# Patient Record
Sex: Female | Born: 1995 | Race: Black or African American | Hispanic: No | Marital: Single | Smoking: Never smoker
Health system: Southern US, Community
[De-identification: ages and names within clinical notes are randomized; demographics above are authoritative.]

## PROBLEM LIST (undated history)

## (undated) HISTORY — PX: OTHER SURGICAL HISTORY: SHX169

---
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
## 2020-03-03 ENCOUNTER — Other Ambulatory Visit (HOSPITAL_COMMUNITY)
Admission: RE | Admit: 2020-03-03 | Discharge: 2020-03-03 | Disposition: A | Discharge status: Home / Self Care | Payer: No Typology Code available for payment source | Source: Ambulatory Visit | Attending: Advanced Practice Midwife | Admitting: Advanced Practice Midwife

## 2020-03-03 ENCOUNTER — Encounter: Payer: Self-pay | Admitting: Advanced Practice Midwife

## 2020-03-03 ENCOUNTER — Ambulatory Visit (INDEPENDENT_AMBULATORY_CARE_PROVIDER_SITE_OTHER): Payer: No Typology Code available for payment source | Admitting: Advanced Practice Midwife

## 2020-03-03 ENCOUNTER — Other Ambulatory Visit: Payer: Self-pay

## 2020-03-03 VITALS — BP 117/63 | HR 85 | Ht 63.0 in | Wt 149.0 lb

## 2020-03-03 DIAGNOSIS — Z3A12 12 weeks gestation of pregnancy: Secondary | ICD-10-CM | POA: Diagnosis not present

## 2020-03-03 DIAGNOSIS — Z3401 Encounter for supervision of normal first pregnancy, first trimester: Secondary | ICD-10-CM | POA: Diagnosis not present

## 2020-03-03 DIAGNOSIS — Z3400 Encounter for supervision of normal first pregnancy, unspecified trimester: Secondary | ICD-10-CM | POA: Insufficient documentation

## 2020-03-03 LAB — POCT URINALYSIS DIPSTICK OB
Blood, UA: NEGATIVE
Glucose, UA: NEGATIVE
Nitrite, UA: NEGATIVE
Spec Grav, UA: 1.015 (ref 1.010–1.025)
pH, UA: 7.5 (ref 5.0–8.0)

## 2020-03-03 NOTE — Progress Notes (Signed)
DATING AND VIABILITY SONOGRAM   Jessica Callahan is a 24 y.o. year old G1P0 with LMP Patient's last menstrual period was 12/10/2019 (exact date). which would correlate to  [redacted]w[redacted]d weeks gestation.  She has regular menstrual cycles.   She is here today for a confirmatory initial sonogram.    GESTATION: SINGLETON     FETAL ACTIVITY:          Heart rate     187          The fetus is very active.    GESTATIONAL AGE AND  BIOMETRICS:  Gestational criteria: Estimated Date of Delivery: 09/15/20 by LMP now at [redacted]w[redacted]d  Previous Scans:0      CROWN RUMP LENGTH           4.68 cm  11-3weeks                                                                               AVERAGE EGA(BY THIS SCAN):  11-3 weeks  WORKING EDD( LMP ): 09-15-2020     TECHNICIAN COMMENTS: Patient informed that the ultrasound is considered a limited obstetric ultrasound and is not intended to be a complete ultrasound exam. Patient also informed that the ultrasound is not being completed with the intent of assessing for fetal or placental anomalies or any pelvic abnormalities. Explained that the purpose of today's ultrasound is to assess for fetal heart rate. Patient acknowledges the purpose of the exam and the limitations of the study.      Armandina Stammer 03/03/2020 9:26 AM

## 2020-03-03 NOTE — Progress Notes (Signed)
Subjective:    Jessica Callahan is a G1P0 [redacted]w[redacted]d being seen today for her first obstetrical visit.  Her obstetrical history is significant for Primigravida. Patient does intend to breast feed. Pregnancy history fully reviewed.  Patient reports no complaints.  Vitals:   03/03/20 0849 03/03/20 0851  BP: 117/63   Pulse: 85   Weight: 149 lb (67.6 kg)   Height:  5\' 3"  (1.6 m)    HISTORY: OB History  Gravida Para Term Preterm AB Living  1            SAB TAB Ectopic Multiple Live Births               # Outcome Date GA Lbr Len/2nd Weight Sex Delivery Anes PTL Lv  1 Current            History reviewed. No pertinent past medical history.  No family history on file.   Exam    Uterus:  Fundal Height: 12 cm  Pelvic Exam:    Perineum: No Hemorrhoids, Normal Perineum   Vulva: Bartholin's, Urethra, Skene's normal   Vagina:  normal mucosa, normal discharge   pH:    Cervix: no cervical motion tenderness   Adnexa: no mass, fullness, tenderness   Bony Pelvis: gynecoid  System: Breast:  normal appearance, no masses or tenderness   Skin: normal coloration and turgor, no rashes    Neurologic: oriented, grossly non-focal   Extremities: normal strength, tone, and muscle mass   HEENT neck supple with midline trachea   Mouth/Teeth mucous membranes moist, pharynx normal without lesions   Neck supple   Cardiovascular: regular rate and rhythm   Respiratory:  appears well, vitals normal, no respiratory distress, acyanotic, normal RR, ear and throat exam is normal, neck free of mass or lymphadenopathy, chest clear, no wheezing, crepitations, rhonchi, normal symmetric air entry   Abdomen: soft, non-tender; bowel sounds normal; no masses,  no organomegaly   Urinary: urethral meatus normal   The patient was counseled on the potential benefits and lack of known risks of COVID vaccination, during pregnancy and breastfeeding, on today's visit. The patient's questions and concerns were addressed today.  The patient is still unsure of her decision for vaccination. The patient is aware that if she chooses not to get an employee mandated vaccination we will provide documentation for her employers in the form of a letter unless a specific exemption form is submitted to the provider. The patient is aware that this documentation is a deferment of a mandated vaccination that will need to be renewed by a provider periodically.        Assessment:    Pregnancy: G1P0 Patient Active Problem List   Diagnosis Date Noted  . Supervision of normal first pregnancy, antepartum 03/03/2020        Plan:     Initial labs drawn. Continue prenatal vitamins. Discussed and offered genetic screening options, including Quad screen/AFP, NIPS testing, and option to decline testing. Benefits/risks/alternatives reviewed. Pt aware that anatomy 03/05/2020 is form of genetic screening with lower accuracy in detecting trisomies than blood work.  Pt chooses genetic screening today. NIPS: ordered. Ultrasound discussed; fetal anatomic survey: ordered. Problem list reviewed and updated. The nature of [facility] with multiple MDs and other Advanced Practice Providers was explained to patient; also emphasized that residents, students are part of our team. Routine obstetric precautions reviewed.   Follow up in 6 weeks. 50% of 30 min visit spent on counseling and coordination  of care.   Welcomed to practice Routines reviewed   Wynelle Bourgeois 03/03/2020

## 2020-03-04 LAB — GC/CHLAMYDIA PROBE AMP (~~LOC~~) NOT AT ARMC
Chlamydia: NEGATIVE
Comment: NEGATIVE
Comment: NORMAL
Neisseria Gonorrhea: NEGATIVE

## 2020-03-07 LAB — URINE CULTURE, OB REFLEX

## 2020-03-07 LAB — CULTURE, OB URINE

## 2020-03-12 LAB — HEMOGLOBPATHY+FER W/A THAL RFX
Ferritin: 84 ng/mL (ref 15–150)
Hgb A2: 3.1 % (ref 1.8–3.2)
Hgb A: 96.9 % (ref 96.4–98.8)
Hgb F: 0 % (ref 0.0–2.0)
Hgb S: 0 %

## 2020-03-12 LAB — CBC/D/PLT+RPR+RH+ABO+RUB AB...
Antibody Screen: NEGATIVE
Basophils Absolute: 0.1 10*3/uL (ref 0.0–0.2)
Basos: 1 %
EOS (ABSOLUTE): 0.2 10*3/uL (ref 0.0–0.4)
Eos: 2 %
HCV Ab: <0.1 s/co ratio (ref 0.0–0.9)
HIV Screen 4th Generation wRfx: NONREACTIVE
Hematocrit: 37.6 % (ref 34.0–46.6)
Hemoglobin: 12 g/dL (ref 11.1–15.9)
Hepatitis B Surface Ag: NEGATIVE
Immature Grans (Abs): 0 10*3/uL (ref 0.0–0.1)
Immature Granulocytes: 0 %
Lymphocytes Absolute: 2 10*3/uL (ref 0.7–3.1)
Lymphs: 17 %
MCH: 28.8 pg (ref 26.6–33.0)
MCHC: 31.9 g/dL (ref 31.5–35.7)
MCV: 90 fL (ref 79–97)
Monocytes Absolute: 0.9 10*3/uL (ref 0.1–0.9)
Monocytes: 7 %
Neutrophils Absolute: 8.5 10*3/uL — ABNORMAL HIGH (ref 1.4–7.0)
Neutrophils: 73 %
Platelets: 186 10*3/uL (ref 150–450)
RBC: 4.16 x10E6/uL (ref 3.77–5.28)
RDW: 12.8 % (ref 11.7–15.4)
RPR Ser Ql: NONREACTIVE
Rh Factor: POSITIVE
Rubella Antibodies, IGG: 6.73 index (ref >0.99)
WBC: 11.7 10*3/uL — ABNORMAL HIGH (ref 3.4–10.8)

## 2020-03-12 LAB — SMN1 COPY NUMBER ANALYSIS (SMA CARRIER SCREENING)

## 2020-03-12 LAB — HCV INTERPRETATION

## 2020-03-30 ENCOUNTER — Telehealth: Payer: Self-pay

## 2020-03-30 NOTE — Telephone Encounter (Signed)
Note created for patient. Armandina Stammer RN

## 2020-03-30 NOTE — Telephone Encounter (Signed)
Patient left message that she needed a note to get out of class. Left message for her to return call to office for more clarification. Armandina Stammer RN

## 2020-04-20 ENCOUNTER — Telehealth: Payer: Self-pay

## 2020-04-20 NOTE — Telephone Encounter (Signed)
Amy from Dr. Koleen Distance, DDS office called requesting a verbal order for pt to have dental x rays. Verbal order was given.Heyden Jaber l Kamaree Berkel, CMA

## 2020-04-21 ENCOUNTER — Encounter: Payer: Self-pay | Admitting: Advanced Practice Midwife

## 2020-04-21 ENCOUNTER — Other Ambulatory Visit: Payer: Self-pay

## 2020-04-21 ENCOUNTER — Ambulatory Visit (INDEPENDENT_AMBULATORY_CARE_PROVIDER_SITE_OTHER): Payer: No Typology Code available for payment source | Admitting: Advanced Practice Midwife

## 2020-04-21 VITALS — BP 106/63 | HR 85 | Wt 146.0 lb

## 2020-04-21 DIAGNOSIS — Z3400 Encounter for supervision of normal first pregnancy, unspecified trimester: Secondary | ICD-10-CM

## 2020-04-21 DIAGNOSIS — Z23 Encounter for immunization: Secondary | ICD-10-CM

## 2020-04-21 DIAGNOSIS — Z3A19 19 weeks gestation of pregnancy: Secondary | ICD-10-CM

## 2020-04-21 NOTE — Addendum Note (Signed)
Addended by: Lorelle Gibbs L on: 04/21/2020 10:44 AM   Modules accepted: Orders

## 2020-04-21 NOTE — Patient Instructions (Signed)

## 2020-04-21 NOTE — Progress Notes (Signed)
   PRENATAL VISIT NOTE  Subjective:  Jessica Callahan is a 24 y.o. G1P0 at [redacted]w[redacted]d being seen today for ongoing prenatal care.  She is currently monitored for the following issues for this low-risk pregnancy and has Supervision of normal first pregnancy, antepartum and Needs flu shot on their problem list.  Patient reports no complaints.  Contractions: Not present. Vag. Bleeding: None.  Movement: Present. Denies leaking of fluid.   The following portions of the patient's history were reviewed and updated as appropriate: allergies, current medications, past family history, past medical history, past social history, past surgical history and problem list.   Objective:   Vitals:   04/21/20 1003  BP: 106/63  Pulse: 85  Weight: 146 lb (66.2 kg)    Fetal Status: Fetal Heart Rate (bpm): 151   Movement: Present     General:  Alert, oriented and cooperative. Patient is in no acute distress.  Skin: Skin is warm and dry. No rash noted.   Cardiovascular: Normal heart rate noted  Respiratory: Normal respiratory effort, no problems with respiration noted  Abdomen: Soft, gravid, appropriate for gestational age.  Pain/Pressure: Absent     Pelvic: Cervical exam deferred        Extremities: Normal range of motion.  Edema: None  Mental Status: Normal mood and affect. Normal behavior. Normal judgment and thought content.   Assessment and Plan:  Pregnancy: G1P0 at [redacted]w[redacted]d 1. [redacted] weeks gestation of pregnancy Lots of fetal movement Excited to do reveal party   "Its a Boy!" - AFP, Serum, Open Spina Bifida  2. Needs flu shot Done today  3. Supervision of normal first pregnancy, antepartum [place] tomorrow  Preterm labor symptoms and general obstetric precautions including but not limited to vaginal bleeding, contractions, leaking of fluid and fetal movement were reviewed in detail with the patient. Please refer to After Visit Summary for other counseling recommendations.   Return in about 4 weeks (around  05/19/2020) for [employer].  Future Appointments  Date Time Provider Department Center  04/22/2020  2:45 PM [facility]  05/19/2020 10:10 AM Aviva Signs, CNM [facility] None    Wynelle Bourgeois, CNM

## 2020-04-22 ENCOUNTER — Ambulatory Visit: Payer: No Typology Code available for payment source | Attending: Advanced Practice Midwife

## 2020-04-22 DIAGNOSIS — Z3400 Encounter for supervision of normal first pregnancy, unspecified trimester: Secondary | ICD-10-CM | POA: Insufficient documentation

## 2020-04-22 DIAGNOSIS — Z3A12 12 weeks gestation of pregnancy: Secondary | ICD-10-CM | POA: Diagnosis present

## 2020-04-23 LAB — AFP, SERUM, OPEN SPINA BIFIDA
AFP MoM: 1.02
AFP Value: 57.3 ng/mL
Gest. Age on Collection Date: 19 weeks
Maternal Age At EDD: 23.4 yr
OSBR Risk 1 IN: 10000
Test Results:: NEGATIVE
Weight: 146 [lb_av]

## 2020-05-19 ENCOUNTER — Encounter: Payer: Self-pay | Admitting: Advanced Practice Midwife

## 2020-05-19 ENCOUNTER — Other Ambulatory Visit: Payer: Self-pay

## 2020-05-19 ENCOUNTER — Ambulatory Visit (INDEPENDENT_AMBULATORY_CARE_PROVIDER_SITE_OTHER): Payer: No Typology Code available for payment source | Admitting: Advanced Practice Midwife

## 2020-05-19 VITALS — BP 116/71 | HR 82 | Wt 152.0 lb

## 2020-05-19 DIAGNOSIS — Z3A23 23 weeks gestation of pregnancy: Secondary | ICD-10-CM

## 2020-05-19 DIAGNOSIS — Z3400 Encounter for supervision of normal first pregnancy, unspecified trimester: Secondary | ICD-10-CM

## 2020-05-19 DIAGNOSIS — N949 Unspecified condition associated with female genital organs and menstrual cycle: Secondary | ICD-10-CM

## 2020-05-19 NOTE — Progress Notes (Signed)
   PRENATAL VISIT NOTE  Subjective:  Jessica Callahan is a 24 y.o. G1P0 at [redacted]w[redacted]d being seen today for ongoing prenatal care.  She is currently monitored for the following issues for this low-risk pregnancy and has Supervision of normal first pregnancy, antepartum and Needs flu shot on their problem list.  Patient reports some round ligament pain.  Contractions: Not present. Vag. Bleeding: None.  Movement: Present. Denies leaking of fluid.   The following portions of the patient's history were reviewed and updated as appropriate: allergies, current medications, past family history, past medical history, past social history, past surgical history and problem list.   Objective:   Vitals:   05/19/20 1002  BP: 116/71  Pulse: 82  Weight: 152 lb (68.9 kg)    Fetal Status: Fetal Heart Rate (bpm): 145   Movement: Present     General:  Alert, oriented and cooperative. Patient is in no acute distress.  Skin: Skin is warm and dry. No rash noted.   Cardiovascular: Normal heart rate noted  Respiratory: Normal respiratory effort, no problems with respiration noted  Abdomen: Soft, gravid, appropriate for gestational age.  Pain/Pressure: Absent     Pelvic: Cervical exam deferred        Extremities: Normal range of motion.  Edema: None  Mental Status: Normal mood and affect. Normal behavior. Normal judgment and thought content.   Assessment and Plan:  Pregnancy: G1P0 at [redacted]w[redacted]d 1. Supervision of normal first pregnancy, antepartum     Discussed second trimester topics  2. Round ligament pain    Discussed, reassured  3. [redacted] weeks gestation of pregnancy     Plan glucola next visit  Preterm labor symptoms and general obstetric precautions including but not limited to vaginal bleeding, contractions, leaking of fluid and fetal movement were reviewed in detail with the patient. Please refer to After Visit Summary for other counseling recommendations.   Return in about 5 weeks (around 06/23/2020) for [facility].  Future Appointments  Date Time Provider Department Center  06/23/2020  9:30 AM Aviva Signs, CNM [facility] None    Wynelle Bourgeois, CNM

## 2020-05-19 NOTE — Patient Instructions (Addendum)
Second Trimester of Pregnancy  The second trimester is from week 14 through week 27 (month 4 through 6). This is often the time in pregnancy that you feel your best. Often times, morning sickness has lessened or quit. You may have more energy, and you may get hungry more often. Your unborn baby is growing rapidly. At the end of the sixth month, he or she is about 9 inches long and weighs about 1 pounds. You will likely feel the baby move between 18 and 20 weeks of pregnancy. Follow these instructions at home: Medicines  Take over-the-counter and prescription medicines only as told by your doctor. Some medicines are safe and some medicines are not safe during pregnancy.  Take a prenatal vitamin that contains at least 600 micrograms (mcg) of folic acid.  If you have trouble pooping (constipation), take medicine that will make your stool soft (stool softener) if your doctor approves. Eating and drinking   Eat regular, healthy meals.  Avoid raw meat and uncooked cheese.  If you get low calcium from the food you eat, talk to your doctor about taking a daily calcium supplement.  Avoid foods that are high in fat and sugars, such as fried and sweet foods.  If you feel sick to your stomach (nauseous) or throw up (vomit): ? Eat 4 or 5 small meals a day instead of 3 large meals. ? Try eating a few soda crackers. ? Drink liquids between meals instead of during meals.  To prevent constipation: ? Eat foods that are high in fiber, like fresh fruits and vegetables, whole grains, and beans. ? Drink enough fluids to keep your pee (urine) clear or pale yellow. Activity  Exercise only as told by your doctor. Stop exercising if you start to have cramps.  Do not exercise if it is too hot, too humid, or if you are in a place of great height (high altitude).  Avoid heavy lifting.  Wear low-heeled shoes. Sit and stand up straight.  You can continue to have sex unless your doctor tells you not  to. Relieving pain and discomfort  Wear a good support bra if your breasts are tender.  Take warm water baths (sitz baths) to soothe pain or discomfort caused by hemorrhoids. Use hemorrhoid cream if your doctor approves.  Rest with your legs raised if you have leg cramps or low back pain.  If you develop puffy, bulging veins (varicose veins) in your legs: ? Wear support hose or compression stockings as told by your doctor. ? Raise (elevate) your feet for 15 minutes, 3-4 times a day. ? Limit salt in your food. Prenatal care  Write down your questions. Take them to your prenatal visits.  Keep all your prenatal visits as told by your doctor. This is important. Safety  Wear your seat belt when driving.  Make a list of emergency phone numbers, including numbers for family, friends, the hospital, and police and fire departments. General instructions  Ask your doctor about the right foods to eat or for help finding a counselor, if you need these services.  Ask your doctor about local prenatal classes. Begin classes before month 6 of your pregnancy.  Do not use hot tubs, steam rooms, or saunas.  Do not douche or use tampons or scented sanitary pads.  Do not cross your legs for long periods of time.  Visit your dentist if you have not done so. Use a soft toothbrush to brush your teeth. Floss gently.  Avoid all smoking, herbs,   and alcohol. Avoid drugs that are not approved by your doctor.  Do not use any products that contain nicotine or tobacco, such as cigarettes and e-cigarettes. If you need help quitting, ask your doctor.  Avoid cat litter boxes and soil used by cats. These carry germs that can cause birth defects in the baby and can cause a loss of your baby (miscarriage) or stillbirth. Contact a doctor if:  You have mild cramps or pressure in your lower belly.  You have pain when you pee (urinate).  You have bad smelling fluid coming from your vagina.  You continue to  feel sick to your stomach (nauseous), throw up (vomit), or have watery poop (diarrhea).  You have a nagging pain in your belly area.  You feel dizzy. Get help right away if:  You have a fever.  You are leaking fluid from your vagina.  You have spotting or bleeding from your vagina.  You have severe belly cramping or pain.  You lose or gain weight rapidly.  You have trouble catching your breath and have chest pain.  You notice sudden or extreme puffiness (swelling) of your face, hands, ankles, feet, or legs.  You have not felt the baby move in over an hour.  You have severe headaches that do not go away when you take medicine.  You have trouble seeing. Summary  The second trimester is from week 14 through week 27 (months 4 through 6). This is often the time in pregnancy that you feel your best.  To take care of yourself and your unborn baby, you will need to eat healthy meals, take medicines only if your doctor tells you to do so, and do activities that are safe for you and your baby.  Call your doctor if you get sick or if you notice anything unusual about your pregnancy. Also, call your doctor if you need help with the right food to eat, or if you want to know what activities are safe for you. This information is not intended to replace advice given to you by your health care provider. Make sure you discuss any questions you have with your health care provider. Document Revised: 10/19/2018 Document Reviewed: 08/02/2016 Elsevier Patient Education  2020 [employer] Inc[employer] Round Ligament Pain  The round ligament is a cord of muscle and tissue that helps support the uterus. It can become a source of pain during pregnancy if it becomes stretched or twisted as the baby grows. The pain usually begins in the second trimester (13-28 weeks) of pregnancy, and it can come and go until the baby is delivered. It is not a serious problem, and it does not cause harm to the baby. Round ligament  pain is usually a short, sharp, and pinching pain, but it can also be a dull, lingering, and aching pain. The pain is felt in the lower side of the abdomen or in the groin. It usually starts deep in the groin and moves up to the outside of the hip area. The pain may occur when you:  Suddenly change position, such as quickly going from a sitting to standing position.  Roll over in bed.  Cough or sneeze.  Do physical activity. Follow these instructions at home:   Watch your condition for any changes.  When the pain starts, relax. Then try any of these methods to help with the pain: ? Sitting down. ? Flexing your knees up to your abdomen. ? Lying on your side with one pillow under your   abdomen and another pillow between your legs. ? Sitting in a warm bath for 15-20 minutes or until the pain goes away.  Take over-the-counter and prescription medicines only as told by your health care provider.  Move slowly when you sit down or stand up.  Avoid long walks if they cause pain.  Stop or reduce your physical activities if they cause pain.  Keep all follow-up visits as told by your health care provider. This is important. Contact a health care provider if:  Your pain does not go away with treatment.  You feel pain in your back that you did not have before.  Your medicine is not helping. Get help right away if:  You have a fever or chills.  You develop uterine contractions.  You have vaginal bleeding.  You have nausea or vomiting.  You have diarrhea.  You have pain when you urinate. Summary  Round ligament pain is felt in the lower abdomen or groin. It is usually a short, sharp, and pinching pain. It can also be a dull, lingering, and aching pain.  This pain usually begins in the second trimester (13-28 weeks). It occurs because the uterus is stretching with the growing baby, and it is not harmful to the baby.  You may notice the pain when you suddenly change position,  when you cough or sneeze, or during physical activity.  Relaxing, flexing your knees to your abdomen, lying on one side, or taking a warm bath may help to get rid of the pain.  Get help from your health care provider if the pain does not go away or if you have vaginal bleeding, nausea, vomiting, diarrhea, or painful urination. This information is not intended to replace advice given to you by your health care provider. Make sure you discuss any questions you have with your health care provider. Document Revised: 12/13/2017 Document Reviewed: 12/13/2017 Elsevier Patient Education  2020 Elsevier Inc.  

## 2020-06-10 ENCOUNTER — Encounter: Payer: No Typology Code available for payment source | Admitting: Family Medicine

## 2020-06-11 ENCOUNTER — Ambulatory Visit (INDEPENDENT_AMBULATORY_CARE_PROVIDER_SITE_OTHER): Payer: No Typology Code available for payment source | Admitting: Family Medicine

## 2020-06-11 ENCOUNTER — Other Ambulatory Visit: Payer: Self-pay

## 2020-06-11 ENCOUNTER — Encounter: Payer: No Typology Code available for payment source | Admitting: Family Medicine

## 2020-06-11 VITALS — BP 112/66 | HR 79 | Wt 157.0 lb

## 2020-06-11 DIAGNOSIS — Z3400 Encounter for supervision of normal first pregnancy, unspecified trimester: Secondary | ICD-10-CM

## 2020-06-11 DIAGNOSIS — N764 Abscess of vulva: Secondary | ICD-10-CM

## 2020-06-11 MED ORDER — SULFAMETHOXAZOLE-TRIMETHOPRIM 800-160 MG PO TABS: 1.0000 | ORAL_TABLET | Freq: Two times a day (BID) | ORAL | 1 refills | Status: DC

## 2020-06-11 MED ORDER — MICONAZOLE NITRATE 2 % VA CREA: 1.0000 | TOPICAL_CREAM | Freq: Every day | VAGINAL | 2 refills | Status: DC

## 2020-06-11 NOTE — Progress Notes (Signed)
   Subjective:    Patient ID: Jessica Callahan, female    DOB: [birth year], 24 y.o.   MRN: [id number]  HPI Patient has abscess in her left inguinal crease. Started about 10 days ago. Was using hot compresses. Started to spontaneously drain 5 days ago and is improving, but still has a large area that is firm.   Review of Systems     Objective:   Physical Exam Vitals reviewed. Exam conducted with a chaperone present.  Constitutional:      Appearance: Normal appearance.  Genitourinary:   Neurological:     Mental Status: She is alert.           Assessment & Plan:  1. Supervision of normal first pregnancy, antepartum   2. Left genital labial abscess Bactrium, warm compresses. Miconazole for yeast if needed. Return if starts to increase in size.

## 2020-06-11 NOTE — Progress Notes (Signed)
Patient states she has a boil on the inside of her leg. Patient states it has gotten smaller.

## 2020-06-23 ENCOUNTER — Other Ambulatory Visit: Payer: Self-pay

## 2020-06-23 ENCOUNTER — Ambulatory Visit (INDEPENDENT_AMBULATORY_CARE_PROVIDER_SITE_OTHER): Payer: No Typology Code available for payment source | Admitting: Advanced Practice Midwife

## 2020-06-23 ENCOUNTER — Encounter: Payer: Self-pay | Admitting: Advanced Practice Midwife

## 2020-06-23 VITALS — BP 113/68 | HR 78

## 2020-06-23 DIAGNOSIS — Z23 Encounter for immunization: Secondary | ICD-10-CM

## 2020-06-23 DIAGNOSIS — Z3400 Encounter for supervision of normal first pregnancy, unspecified trimester: Secondary | ICD-10-CM

## 2020-06-23 DIAGNOSIS — Z3A28 28 weeks gestation of pregnancy: Secondary | ICD-10-CM

## 2020-06-23 NOTE — Progress Notes (Signed)
   PRENATAL VISIT NOTE  Subjective:  Jessica Callahan is a 24 y.o. G1P0 at [redacted]w[redacted]d being seen today for ongoing prenatal care.  She is currently monitored for the following issues for this low-risk pregnancy and has Supervision of normal first pregnancy, antepartum and Needs flu shot on their problem list.   Just graduated with BS in Kinesiology  Patient reports no complaints.  Contractions: Not present. Vag. Bleeding: None.  Movement: Present. Denies leaking of fluid.   The following portions of the patient's history were reviewed and updated as appropriate: allergies, current medications, past family history, past medical history, past social history, past surgical history and problem list.   Objective:   Vitals:   06/23/20 0923  BP: 113/68  Pulse: 78    Fetal Status: Fetal Heart Rate (bpm): 150   Movement: Present     General:  Alert, oriented and cooperative. Patient is in no acute distress.  Skin: Skin is warm and dry. No rash noted.   Cardiovascular: Normal heart rate noted  Respiratory: Normal respiratory effort, no problems with respiration noted  Abdomen: Soft, gravid, appropriate for gestational age.  Pain/Pressure: Absent     Pelvic: Cervical exam deferred        Extremities: Normal range of motion.  Edema: None  Mental Status: Normal mood and affect. Normal behavior. Normal judgment and thought content.   Assessment and Plan:  Pregnancy: G1P0 at [redacted]w[redacted]d 1. Supervision of normal first pregnancy, antepartum  - Glucose Tolerance, 2 Hours w/1 Hour - CBC - RPR - HIV antibody (with reflex)  2. [redacted] weeks gestation of pregnancy     Doing well, excited  - Glucose Tolerance, 2 Hours w/1 Hour - CBC - RPR - HIV antibody (with reflex)  Preterm labor symptoms and general obstetric precautions including but not limited to vaginal bleeding, contractions, leaking of fluid and fetal movement were reviewed in detail with the patient. Please refer to After Visit Summary for other  counseling recommendations.   Return in about 2 weeks (around 07/07/2020) for [facility].   Wynelle Bourgeois, CNM

## 2020-06-23 NOTE — Patient Instructions (Signed)
Glucose Tolerance Test During Pregnancy Why am I having this test? The glucose tolerance test (GTT) is done to check how your body processes sugar (glucose). This is one of several tests used to diagnose diabetes that develops during pregnancy (gestational diabetes mellitus). Gestational diabetes is a temporary form of diabetes that some women develop during pregnancy. It usually occurs during the second trimester of pregnancy and goes away after delivery. Testing (screening) for gestational diabetes usually occurs between 24 and 28 weeks of pregnancy. You may have the GTT test after having a 1-hour glucose screening test if the results from that test indicate that you may have gestational diabetes. You may also have this test if:  You have a history of gestational diabetes.  You have a history of giving birth to very large babies or have experienced repeated fetal loss (stillbirth).  You have signs and symptoms of diabetes, such as: ? Changes in your vision. ? Tingling or numbness in your hands or feet. ? Changes in hunger, thirst, and urination that are not otherwise explained by your pregnancy. What is being tested? This test measures the amount of glucose in your blood at different times during a period of 3 hours. This indicates how well your body is able to process glucose. What kind of sample is taken?  Blood samples are required for this test. They are usually collected by inserting a needle into a blood vessel. How do I prepare for this test?  For 3 days before your test, eat normally. Have plenty of carbohydrate-rich foods.  Follow instructions from your health care provider about: ? Eating or drinking restrictions on the day of the test. You may be asked to not eat or drink anything other than water (fast) starting 8-10 hours before the test. ? Changing or stopping your regular medicines. Some medicines may interfere with this test. Tell a health care provider about:  All  medicines you are taking, including vitamins, herbs, eye drops, creams, and over-the-counter medicines.  Any blood disorders you have.  Any surgeries you have had.  Any medical conditions you have. What happens during the test? First, your blood glucose will be measured. This is referred to as your fasting blood glucose, since you fasted before the test. Then, you will drink a glucose solution that contains a certain amount of glucose. Your blood glucose will be measured again 1, 2, and 3 hours after drinking the solution. This test takes about 3 hours to complete. You will need to stay at the testing location during this time. During the testing period:  Do not eat or drink anything other than the glucose solution.  Do not exercise.  Do not use any products that contain nicotine or tobacco, such as cigarettes and e-cigarettes. If you need help stopping, ask your health care provider. The testing procedure may vary among health care providers and hospitals. How are the results reported? Your results will be reported as milligrams of glucose per deciliter of blood (mg/dL) or millimoles per liter (mmol/L). Your health care provider will compare your results to normal ranges that were established after testing a large group of people (reference ranges). Reference ranges may vary among labs and hospitals. For this test, common reference ranges are:  Fasting: less than 95-105 mg/dL (5.3-5.8 mmol/L).  1 hour after drinking glucose: less than 180-190 mg/dL (10.0-10.5 mmol/L).  2 hours after drinking glucose: less than 155-165 mg/dL (8.6-9.2 mmol/L).  3 hours after drinking glucose: 140-145 mg/dL (7.8-8.1 mmol/L). What do the   results mean? Results within reference ranges are considered normal, meaning that your glucose levels are well-controlled. If two or more of your blood glucose levels are high, you may be diagnosed with gestational diabetes. If only one level is high, your health care  provider may suggest repeat testing or other tests to confirm a diagnosis. Talk with your health care provider about what your results mean. Questions to ask your health care provider Ask your health care provider, or the department that is doing the test:  When will my results be ready?  How will I get my results?  What are my treatment options?  What other tests do I need?  What are my next steps? Summary  The glucose tolerance test (GTT) is one of several tests used to diagnose diabetes that develops during pregnancy (gestational diabetes mellitus). Gestational diabetes is a temporary form of diabetes that some women develop during pregnancy.  You may have the GTT test after having a 1-hour glucose screening test if the results from that test indicate that you may have gestational diabetes. You may also have this test if you have any symptoms or risk factors for gestational diabetes.  Talk with your health care provider about what your results mean. This information is not intended to replace advice given to you by your health care provider. Make sure you discuss any questions you have with your health care provider. Document Revised: 10/18/2018 Document Reviewed: 02/06/2017 Elsevier Patient Education  2020 Elsevier Inc.  

## 2020-06-24 LAB — CBC
Hematocrit: 35.8 % (ref 34.0–46.6)
Hemoglobin: 11.8 g/dL (ref 11.1–15.9)
MCH: 29.8 pg (ref 26.6–33.0)
MCHC: 33 g/dL (ref 31.5–35.7)
MCV: 90 fL (ref 79–97)
Platelets: 174 10*3/uL (ref 150–450)
RBC: 3.96 x10E6/uL (ref 3.77–5.28)
RDW: 12.4 % (ref 11.7–15.4)
WBC: 19 10*3/uL — ABNORMAL HIGH (ref 3.4–10.8)

## 2020-06-24 LAB — RPR: RPR Ser Ql: NONREACTIVE

## 2020-06-24 LAB — GLUCOSE TOLERANCE, 2 HOURS W/ 1HR
Glucose, 1 hour: 158 mg/dL (ref 65–179)
Glucose, 2 hour: 146 mg/dL (ref 65–152)
Glucose, Fasting: 73 mg/dL (ref 65–91)

## 2020-06-24 LAB — HIV ANTIBODY (ROUTINE TESTING W REFLEX): HIV Screen 4th Generation wRfx: NONREACTIVE

## 2020-07-07 ENCOUNTER — Other Ambulatory Visit: Payer: Self-pay

## 2020-07-07 ENCOUNTER — Ambulatory Visit (INDEPENDENT_AMBULATORY_CARE_PROVIDER_SITE_OTHER): Payer: No Typology Code available for payment source | Admitting: Obstetrics & Gynecology

## 2020-07-07 VITALS — BP 112/65 | HR 83 | Wt 163.0 lb

## 2020-07-07 DIAGNOSIS — Z3A30 30 weeks gestation of pregnancy: Secondary | ICD-10-CM

## 2020-07-07 DIAGNOSIS — Z3400 Encounter for supervision of normal first pregnancy, unspecified trimester: Secondary | ICD-10-CM

## 2020-07-07 MED ORDER — AMBULATORY NON FORMULARY MEDICATION: 1.0000 | 0 refills | Status: DC

## 2020-07-07 NOTE — Progress Notes (Signed)
   PRENATAL VISIT NOTE  Subjective:  Jessica Callahan is a 24 y.o. G1P0 at [redacted]w[redacted]d being seen today for ongoing prenatal care.  She is currently monitored for the following issues for this low-risk pregnancy and has Supervision of normal first pregnancy, antepartum and Needs flu shot on their problem list.  Patient reports no complaints.  Contractions: Not present. Vag. Bleeding: None.  Movement: Present. Denies leaking of fluid.   She just graduated from [employer].  Was able to walk in December.  Had four family member who could attend but it was live streamed.   The following portions of the patient's history were reviewed and updated as appropriate: allergies, current medications, past family history, past medical history, past social history, past surgical history and problem list.   Objective:   Vitals:   07/07/20 1554  BP: 112/65  Pulse: 83  Weight: 163 lb (73.9 kg)    Fetal Status: Fetal Heart Rate (bpm): 150 Fundal Height: 30 cm Movement: Present     General:  Alert, oriented and cooperative. Patient is in no acute distress.  Skin: Skin is warm and dry. No rash noted.   Cardiovascular: Normal heart rate noted  Respiratory: Normal respiratory effort, no problems with respiration noted  Abdomen: Soft, gravid, appropriate for gestational age.  Pain/Pressure: Present     Pelvic: Cervical exam deferred        Extremities: Normal range of motion.  Edema: None  Mental Status: Normal mood and affect. Normal behavior. Normal judgment and thought content.   Assessment and Plan:  Pregnancy: G1P0 at [redacted]w[redacted]d 1. [redacted] weeks gestation of pregnancy - taking PNV  2. Supervision of normal first pregnancy, antepartum - she is considering water birth.  Aware needs to do class and seen CNM.   Preterm labor symptoms and general obstetric precautions including but not limited to vaginal bleeding, contractions, leaking of fluid and fetal movement were reviewed in detail with the patient. Please refer to  After Visit Summary for other counseling recommendations.   Return in about 2 weeks (around 07/21/2020) for Office ob visit (MD or APP).  Future Appointments  Date Time Provider Department Center  07/23/2020  3:15 PM Levie Heritage, DO [facility] None    Jerene Bears, MD

## 2020-07-07 NOTE — Addendum Note (Signed)
Addended by: Mikey Bussing on: 07/07/2020 04:35 PM   Modules accepted: Orders

## 2020-07-11 NOTE — L&D Delivery Note (Signed)
Delivery Note At 5:26 PM a viable female was delivered via waterbirth (Presentation: OA).  APGAR: 9&9, weight pending  .   Placenta status: delivered intact with gentle traction.      Anesthesia:  none Episiotomy:  None Lacerations:  Second degree, repaired Suture Repair: 3.0 vicryl Est. Blood Loss (mL):  75cc  Mom to postpartum.  Baby to Couplet care / Skin to Skin. Delivering clinicians were Luna Kitchens and Casper Harrison  Casper Harrison, MD [facility] Family Medicine Fellow, [facility] for [facility], [facility]  Charlesetta Garibaldi [facility] 09/09/2020, 5:36 PM

## 2020-07-23 ENCOUNTER — Other Ambulatory Visit: Payer: Self-pay

## 2020-07-23 ENCOUNTER — Ambulatory Visit (INDEPENDENT_AMBULATORY_CARE_PROVIDER_SITE_OTHER): Payer: No Typology Code available for payment source | Admitting: Family Medicine

## 2020-07-23 DIAGNOSIS — Z3400 Encounter for supervision of normal first pregnancy, unspecified trimester: Secondary | ICD-10-CM

## 2020-07-23 NOTE — Progress Notes (Signed)
   PRENATAL VISIT NOTE  Subjective:  Jessica Callahan is a 25 y.o. G1P0 at [redacted]w[redacted]d being seen today for ongoing prenatal care.  She is currently monitored for the following issues for this low-risk pregnancy and has Supervision of normal first pregnancy, antepartum and Needs flu shot on their problem list.  Patient reports no complaints.  Contractions: Not present. Vag. Bleeding: None.  Movement: Present. Denies leaking of fluid.   The following portions of the patient's history were reviewed and updated as appropriate: allergies, current medications, past family history, past medical history, past social history, past surgical history and problem list.   Objective:   Vitals:   07/23/20 1517  BP: 114/77  Pulse: 94  Weight: 164 lb (74.4 kg)    Fetal Status: Fetal Heart Rate (bpm): 151   Movement: Present     General:  Alert, oriented and cooperative. Patient is in no acute distress.  Skin: Skin is warm and dry. No rash noted.   Cardiovascular: Normal heart rate noted  Respiratory: Normal respiratory effort, no problems with respiration noted  Abdomen: Soft, gravid, appropriate for gestational age.  Pain/Pressure: Present     Pelvic: Cervical exam deferred        Extremities: Normal range of motion.  Edema: None  Mental Status: Normal mood and affect. Normal behavior. Normal judgment and thought content.   Assessment and Plan:  Pregnancy: G1P0 at [redacted]w[redacted]d 1. Supervision of normal first pregnancy, antepartum FHT and FH normal  Preterm labor symptoms and general obstetric precautions including but not limited to vaginal bleeding, contractions, leaking of fluid and fetal movement were reviewed in detail with the patient. Please refer to After Visit Summary for other counseling recommendations.   No follow-ups on file.  Future Appointments  Date Time Provider Department Center  08/04/2020 10:05 AM Aviva Signs, CNM [facility] None  08/18/2020 11:15 AM Aviva Signs, CNM [facility] None   08/27/2020 11:15 AM Levie Heritage, DO [facility] None  09/03/2020 11:15 AM Levie Heritage, DO [facility] None  09/10/2020 11:15 AM Levie Heritage, DO [facility] None    Levie Heritage, DO

## 2020-08-04 ENCOUNTER — Encounter: Payer: Self-pay | Admitting: Advanced Practice Midwife

## 2020-08-04 ENCOUNTER — Other Ambulatory Visit: Payer: Self-pay

## 2020-08-04 ENCOUNTER — Ambulatory Visit (INDEPENDENT_AMBULATORY_CARE_PROVIDER_SITE_OTHER): Payer: No Typology Code available for payment source | Admitting: Advanced Practice Midwife

## 2020-08-04 VITALS — BP 110/60 | HR 82 | Wt 166.0 lb

## 2020-08-04 DIAGNOSIS — Z3A34 34 weeks gestation of pregnancy: Secondary | ICD-10-CM

## 2020-08-04 DIAGNOSIS — Z3400 Encounter for supervision of normal first pregnancy, unspecified trimester: Secondary | ICD-10-CM

## 2020-08-04 NOTE — Progress Notes (Signed)
   PRENATAL VISIT NOTE  Subjective:  Jessica Callahan is a 25 y.o. G1P0 at [redacted]w[redacted]d being seen today for ongoing prenatal care.  She is currently monitored for the following issues for this low-risk pregnancy and has Supervision of normal first pregnancy, antepartum on their problem list.  Patient reports no complaints.  Contractions: Irregular. Vag. Bleeding: None.  Movement: Present. Denies leaking of fluid.   The following portions of the patient's history were reviewed and updated as appropriate: allergies, current medications, past family history, past medical history, past social history, past surgical history and problem list.   Objective:   Vitals:   08/04/20 1005  BP: 110/60  Pulse: 82  Weight: 166 lb (75.3 kg)    Fetal Status: Fetal Heart Rate (bpm): 152   Movement: Present     General:  Alert, oriented and cooperative. Patient is in no acute distress.  Skin: Skin is warm and dry. No rash noted.   Cardiovascular: Normal heart rate noted  Respiratory: Normal respiratory effort, no problems with respiration noted  Abdomen: Soft, gravid, appropriate for gestational age.  Pain/Pressure: Absent     Pelvic: Cervical exam deferred        Extremities: Normal range of motion.  Edema: None  Mental Status: Normal mood and affect. Normal behavior. Normal judgment and thought content.   Assessment and Plan:  Pregnancy: G1P0 at [redacted]w[redacted]d 1. [redacted] weeks gestation of pregnancy      Wants to consider waterbirth      Issues reviewed.   Needs to take class, info given      Next visit will sign consent after class done      Discussed flexibility, things that will risk her out   2. Supervision of normal first pregnancy, antepartum     GBS next visit     PTL precautions  Preterm labor symptoms and general obstetric precautions including but not limited to vaginal bleeding, contractions, leaking of fluid and fetal movement were reviewed in detail with the patient. Please refer to After Visit Summary  for other counseling recommendations.   Return in about 2 weeks (around 08/18/2020) for [facility]  Future Appointments  Date Time Provider Department Center  08/18/2020 11:15 AM Aviva Signs, CNM [facility] None  08/27/2020 11:15 AM Levie Heritage, DO [facility] None  09/03/2020 11:15 AM Levie Heritage, DO [facility] None  09/10/2020 11:15 AM Adrian Blackwater Rhona Raider, DO [facility] None    Wynelle Bourgeois, CNM

## 2020-08-04 NOTE — Patient Instructions (Signed)
Thinking About Waterbirth???  You must attend a Waterbirth class at [facility]  3rd Wednesday of every month from 7-9pm  Free  Register by calling [phone] or online at [email or web address]  Bring us the certificate from the class  Waterbirth supplies needed for [facility] patients:  Our practice has a Birth Pool in a Box tub at the hospital that you can borrow  You will need to purchase an accessory kit that has all needed supplies through [facility] (  ) or online  Or you can purchase the supplies separately: o Single-use disposable tub liner for Birth Pool in a Box (REGULAR size) o New garden hose labeled "lead-free", "suitable for drinking water", "non-toxic" OR "water potable" o Garden hose to remove the dirty water o Electric drain pump to remove water (We recommend 792 gallon per hour or greater pump.)  o Fish net o Bathing suit top (optional) o Long-handled mirror (optional)  [employer] sells tubs for ~ $120 if you would rather purchase your own tub  [employer] (www.[email or web address]) $275 for tub rental/set-up & take down/kit   Things that would prevent you from having a waterbirth:  Premature, <37wks  Previous cesarean birth  Presence of thick meconium-stained fluid  Multiple gestation (Twins, triplets, etc.)  Uncontrolled diabetes  Hypertension  Heavy vaginal bleeding  Non-reassuring fetal heart rate  Active infection (MRSA, etc.)  If your labor has to be induced  Other risk issues identified by your obstetrical provider    

## 2020-08-18 ENCOUNTER — Other Ambulatory Visit: Payer: Self-pay

## 2020-08-18 ENCOUNTER — Encounter: Payer: Self-pay | Admitting: Advanced Practice Midwife

## 2020-08-18 ENCOUNTER — Other Ambulatory Visit (HOSPITAL_COMMUNITY)
Admission: RE | Admit: 2020-08-18 | Discharge: 2020-08-18 | Disposition: A | Discharge status: Home / Self Care | Payer: No Typology Code available for payment source | Source: Ambulatory Visit | Attending: Advanced Practice Midwife | Admitting: Advanced Practice Midwife

## 2020-08-18 ENCOUNTER — Ambulatory Visit (INDEPENDENT_AMBULATORY_CARE_PROVIDER_SITE_OTHER): Payer: No Typology Code available for payment source | Admitting: Advanced Practice Midwife

## 2020-08-18 VITALS — BP 115/66 | HR 86 | Wt 169.0 lb

## 2020-08-18 DIAGNOSIS — Z3A36 36 weeks gestation of pregnancy: Secondary | ICD-10-CM

## 2020-08-18 DIAGNOSIS — Z3400 Encounter for supervision of normal first pregnancy, unspecified trimester: Secondary | ICD-10-CM

## 2020-08-18 NOTE — Progress Notes (Signed)
   PRENATAL VISIT NOTE  Subjective:  Jessica Callahan is a 25 y.o. G1P0 at [redacted]w[redacted]d being seen today for ongoing prenatal care.  She is currently monitored for the following issues for this low-risk pregnancy and has Supervision of normal first pregnancy, antepartum on their problem list.  Patient reports no complaints.  Contractions: Irritability. Vag. Bleeding: None.  Movement: Present. Denies leaking of fluid.   The following portions of the patient's history were reviewed and updated as appropriate: allergies, current medications, past family history, past medical history, past social history, past surgical history and problem list.   Objective:   Vitals:   08/18/20 1123  BP: 115/66  Pulse: 86  Weight: 169 lb (76.7 kg)    Fetal Status: Fetal Heart Rate (bpm): 150   Movement: Present     General:  Alert, oriented and cooperative. Patient is in no acute distress.  Skin: Skin is warm and dry. No rash noted.   Cardiovascular: Normal heart rate noted  Respiratory: Normal respiratory effort, no problems with respiration noted  Abdomen: Soft, gravid, appropriate for gestational age.  Pain/Pressure: Present     Pelvic:  Closed/ 40% / -3/ vertex  Extremities: Normal range of motion.  Edema: None  Mental Status: Normal mood and affect. Normal behavior. Normal judgment and thought content.   Assessment and Plan:  Pregnancy: G1P0 at [redacted]w[redacted]d 1. Supervision of normal first pregnancy, antepartum      Discussed FM, signs of labor - Culture, beta strep (group b only) - GC/Chlamydia probe amp ([place])not at [facility]  2. [redacted] weeks gestation of pregnancy     Signed up for WB class on 08/01/20     Discussed issues again re: risking out, need to sign consent after class  Preterm labor symptoms and general obstetric precautions including but not limited to vaginal bleeding, contractions, leaking of fluid and fetal movement were reviewed in detail with the patient. Please refer to After Visit Summary for  other counseling recommendations.   Return in about 1 week (around 08/25/2020) for [facility].  Future Appointments  Date Time Provider Department Center  08/27/2020 11:15 AM Levie Heritage, DO [facility] None  09/03/2020 11:15 AM Levie Heritage, DO [facility] None  09/10/2020 11:15 AM Adrian Blackwater Rhona Raider, DO [facility] None    Wynelle Bourgeois, CNM

## 2020-08-18 NOTE — Patient Instructions (Signed)

## 2020-08-19 LAB — GC/CHLAMYDIA PROBE AMP (~~LOC~~) NOT AT ARMC
Chlamydia: NEGATIVE
Comment: NEGATIVE
Comment: NORMAL
Neisseria Gonorrhea: NEGATIVE

## 2020-08-21 ENCOUNTER — Encounter: Payer: Self-pay | Admitting: Advanced Practice Midwife

## 2020-08-21 DIAGNOSIS — B951 Streptococcus, group B, as the cause of diseases classified elsewhere: Secondary | ICD-10-CM | POA: Insufficient documentation

## 2020-08-21 LAB — CULTURE, BETA STREP (GROUP B ONLY): Strep Gp B Culture: POSITIVE — AB

## 2020-08-26 ENCOUNTER — Encounter: Payer: No Typology Code available for payment source | Admitting: Obstetrics and Gynecology

## 2020-08-27 ENCOUNTER — Other Ambulatory Visit: Payer: Self-pay

## 2020-08-27 ENCOUNTER — Encounter: Payer: Self-pay | Admitting: Family Medicine

## 2020-08-27 ENCOUNTER — Ambulatory Visit (INDEPENDENT_AMBULATORY_CARE_PROVIDER_SITE_OTHER): Payer: No Typology Code available for payment source | Admitting: Family Medicine

## 2020-08-27 VITALS — BP 110/72 | HR 87 | Wt 170.0 lb

## 2020-08-27 DIAGNOSIS — Z3A37 37 weeks gestation of pregnancy: Secondary | ICD-10-CM

## 2020-08-27 DIAGNOSIS — Z3400 Encounter for supervision of normal first pregnancy, unspecified trimester: Secondary | ICD-10-CM

## 2020-08-27 DIAGNOSIS — B951 Streptococcus, group B, as the cause of diseases classified elsewhere: Secondary | ICD-10-CM

## 2020-08-27 NOTE — Progress Notes (Signed)
ROB [redacted]w[redacted]d GBS + on 08/18/20.  DU:KGUR  Pt wants cervix check today.

## 2020-08-27 NOTE — Progress Notes (Signed)
   PRENATAL VISIT NOTE  Subjective:  Jessica Callahan is a 25 y.o. G1P0 at [redacted]w[redacted]d being seen today for ongoing prenatal care.  She is currently monitored for the following issues for this low-risk pregnancy and has Supervision of normal first pregnancy, antepartum and Positive GBS test on their problem list.  Patient reports occasional contractions.  Contractions: Irritability. Vag. Bleeding: None.  Movement: Present. Denies leaking of fluid.   The following portions of the patient's history were reviewed and updated as appropriate: allergies, current medications, past family history, past medical history, past social history, past surgical history and problem list.   Objective:   Vitals:   08/27/20 1135  BP: 110/72  Pulse: 87  Weight: 170 lb (77.1 kg)    Fetal Status: Fetal Heart Rate (bpm): 150 Fundal Height: 37 cm Movement: Present  Presentation: Vertex  General:  Alert, oriented and cooperative. Patient is in no acute distress.  Skin: Skin is warm and dry. No rash noted.   Cardiovascular: Normal heart rate noted  Respiratory: Normal respiratory effort, no problems with respiration noted  Abdomen: Soft, gravid, appropriate for gestational age.  Pain/Pressure: Absent     Pelvic: Cervical exam performed in the presence of a chaperone Dilation: 3 Effacement (%): 50 Station: -3  Extremities: Normal range of motion.  Edema: None  Mental Status: Normal mood and affect. Normal behavior. Normal judgment and thought content.   Assessment and Plan:  Pregnancy: G1P0 at [redacted]w[redacted]d 1. Supervision of normal first pregnancy, antepartum FHT and FH normal  2. [redacted] weeks gestation of pregnancy  3. GBS  Intrapartum PPx  Term labor symptoms and general obstetric precautions including but not limited to vaginal bleeding, contractions, leaking of fluid and fetal movement were reviewed in detail with the patient. Please refer to After Visit Summary for other counseling recommendations.   No follow-ups on  file.  Future Appointments  Date Time Provider Department Center  09/03/2020 11:15 AM Levie Heritage, DO [facility] None  09/10/2020 11:15 AM Levie Heritage, DO [facility] None    Levie Heritage, DO

## 2020-08-31 ENCOUNTER — Telehealth: Payer: Self-pay

## 2020-08-31 NOTE — Telephone Encounter (Signed)
Pt called stating her Dentist prescribed Amoxicillin for an infected tooth. Pt states she may have to have a root canal and wanted to make our office aware.  Markise Haymer l Joei Frangos, CMA

## 2020-09-03 ENCOUNTER — Other Ambulatory Visit: Payer: Self-pay

## 2020-09-03 ENCOUNTER — Ambulatory Visit (INDEPENDENT_AMBULATORY_CARE_PROVIDER_SITE_OTHER): Payer: No Typology Code available for payment source | Admitting: Family Medicine

## 2020-09-03 VITALS — BP 125/76 | HR 93 | Wt 168.0 lb

## 2020-09-03 DIAGNOSIS — Z3A38 38 weeks gestation of pregnancy: Secondary | ICD-10-CM

## 2020-09-03 DIAGNOSIS — Z3400 Encounter for supervision of normal first pregnancy, unspecified trimester: Secondary | ICD-10-CM

## 2020-09-03 DIAGNOSIS — B951 Streptococcus, group B, as the cause of diseases classified elsewhere: Secondary | ICD-10-CM

## 2020-09-03 NOTE — Progress Notes (Signed)
   PRENATAL VISIT NOTE  Subjective:  Jessica Callahan is a 25 y.o. G1P0 at [redacted]w[redacted]d being seen today for ongoing prenatal care.  She is currently monitored for the following issues for this low-risk pregnancy and has Supervision of normal first pregnancy, antepartum and Positive GBS test on their problem list.  Patient reports occasional contractions.  Contractions: Irritability. Vag. Bleeding: None.  Movement: Present. Denies leaking of fluid.   The following portions of the patient's history were reviewed and updated as appropriate: allergies, current medications, past family history, past medical history, past social history, past surgical history and problem list.   Objective:   Vitals:   09/03/20 1120  BP: 125/76  Pulse: 93  Weight: 168 lb (76.2 kg)    Fetal Status: Fetal Heart Rate (bpm): 160 Fundal Height: 38 cm Movement: Present     General:  Alert, oriented and cooperative. Patient is in no acute distress.  Skin: Skin is warm and dry. No rash noted.   Cardiovascular: Normal heart rate noted  Respiratory: Normal respiratory effort, no problems with respiration noted  Abdomen: Soft, gravid, appropriate for gestational age.  Pain/Pressure: Absent     Pelvic: Cervical exam deferred        Extremities: Normal range of motion.  Edema: None  Mental Status: Normal mood and affect. Normal behavior. Normal judgment and thought content.   Assessment and Plan:  Pregnancy: G1P0 at [redacted]w[redacted]d 1. [redacted] weeks gestation of pregnancy  2. Supervision of normal first pregnancy, antepartum FHT and FH normal  3. Positive GBS test Intrapartum PPx  Preterm labor symptoms and general obstetric precautions including but not limited to vaginal bleeding, contractions, leaking of fluid and fetal movement were reviewed in detail with the patient. Please refer to After Visit Summary for other counseling recommendations.   No follow-ups on file.  Future Appointments  Date Time Provider Department Center   09/10/2020 11:15 AM Levie Heritage, DO [facility] None    Levie Heritage, DO

## 2020-09-09 ENCOUNTER — Other Ambulatory Visit: Payer: Self-pay

## 2020-09-09 ENCOUNTER — Encounter (HOSPITAL_COMMUNITY): Payer: Self-pay | Admitting: Obstetrics and Gynecology

## 2020-09-09 ENCOUNTER — Inpatient Hospital Stay (HOSPITAL_COMMUNITY)
Admission: AD | Admit: 2020-09-09 | Discharge: 2020-09-11 | DRG: 807 | Disposition: A | Discharge status: Home / Self Care | Payer: No Typology Code available for payment source | Attending: Family Medicine | Admitting: Family Medicine

## 2020-09-09 DIAGNOSIS — O9081 Anemia of the puerperium: Secondary | ICD-10-CM | POA: Diagnosis not present

## 2020-09-09 DIAGNOSIS — O99824 Streptococcus B carrier state complicating childbirth: Secondary | ICD-10-CM | POA: Diagnosis present

## 2020-09-09 DIAGNOSIS — O26893 Other specified pregnancy related conditions, third trimester: Secondary | ICD-10-CM | POA: Diagnosis present

## 2020-09-09 DIAGNOSIS — Z3400 Encounter for supervision of normal first pregnancy, unspecified trimester: Secondary | ICD-10-CM

## 2020-09-09 DIAGNOSIS — R030 Elevated blood-pressure reading, without diagnosis of hypertension: Secondary | ICD-10-CM | POA: Diagnosis present

## 2020-09-09 DIAGNOSIS — B951 Streptococcus, group B, as the cause of diseases classified elsewhere: Secondary | ICD-10-CM

## 2020-09-09 DIAGNOSIS — O4693 Antepartum hemorrhage, unspecified, third trimester: Secondary | ICD-10-CM | POA: Diagnosis present

## 2020-09-09 DIAGNOSIS — Z3A39 39 weeks gestation of pregnancy: Secondary | ICD-10-CM

## 2020-09-09 DIAGNOSIS — Z20822 Contact with and (suspected) exposure to covid-19: Secondary | ICD-10-CM | POA: Diagnosis present

## 2020-09-09 LAB — COMPREHENSIVE METABOLIC PANEL
ALT: 38 U/L (ref 0–44)
AST: 26 U/L (ref 15–41)
Albumin: 2.7 g/dL — ABNORMAL LOW (ref 3.5–5.0)
Alkaline Phosphatase: 141 U/L — ABNORMAL HIGH (ref 38–126)
Anion gap: 9 (ref 5–15)
BUN: 5 mg/dL — ABNORMAL LOW (ref 6–20)
CO2: 20 mmol/L — ABNORMAL LOW (ref 22–32)
Calcium: 8.6 mg/dL — ABNORMAL LOW (ref 8.9–10.3)
Chloride: 105 mmol/L (ref 98–111)
Creatinine, Ser: 0.58 mg/dL (ref 0.44–1.00)
GFR, Estimated: >60 mL/min (ref >60)
Glucose, Bld: 76 mg/dL (ref 70–99)
Potassium: 3.9 mmol/L (ref 3.5–5.1)
Sodium: 134 mmol/L — ABNORMAL LOW (ref 135–145)
Total Bilirubin: 0.9 mg/dL (ref 0.3–1.2)
Total Protein: 6 g/dL — ABNORMAL LOW (ref 6.5–8.1)

## 2020-09-09 LAB — TYPE AND SCREEN
ABO/RH(D): AB POS
Antibody Screen: NEGATIVE

## 2020-09-09 LAB — CBC
HCT: 34.2 % — ABNORMAL LOW (ref 36.0–46.0)
Hemoglobin: 11.1 g/dL — ABNORMAL LOW (ref 12.0–15.0)
MCH: 29.4 pg (ref 26.0–34.0)
MCHC: 32.5 g/dL (ref 30.0–36.0)
MCV: 90.5 fL (ref 80.0–100.0)
Platelets: 187 10*3/uL (ref 150–400)
RBC: 3.78 MIL/uL — ABNORMAL LOW (ref 3.87–5.11)
RDW: 13.8 % (ref 11.5–15.5)
WBC: 16.1 10*3/uL — ABNORMAL HIGH (ref 4.0–10.5)
nRBC: 0 % (ref 0.0–0.2)

## 2020-09-09 LAB — RPR: RPR Ser Ql: NONREACTIVE

## 2020-09-09 LAB — RESP PANEL BY RT-PCR (FLU A&B, COVID) ARPGX2
Influenza A by PCR: NEGATIVE
Influenza B by PCR: NEGATIVE
SARS Coronavirus 2 by RT PCR: NEGATIVE

## 2020-09-09 LAB — PROTEIN / CREATININE RATIO, URINE
Creatinine, Urine: 143.26 mg/dL
Protein Creatinine Ratio: 0.13 mg/mg{Cre} (ref 0.00–0.15)
Total Protein, Urine: 19 mg/dL

## 2020-09-09 MED ORDER — EPHEDRINE 5 MG/ML INJ: 10.0000 mg | INTRAVENOUS | Status: DC | PRN

## 2020-09-09 MED ORDER — PHENYLEPHRINE 40 MCG/ML (10ML) SYRINGE FOR IV PUSH (FOR BLOOD PRESSURE SUPPORT): 80.0000 ug | PREFILLED_SYRINGE | INTRAVENOUS | Status: DC | PRN

## 2020-09-09 MED ORDER — LIDOCAINE HCL (PF) 1 % IJ SOLN
30.0000 mL | INTRAMUSCULAR | Status: DC | PRN
  Filled 2020-09-09: qty 30

## 2020-09-09 MED ORDER — SODIUM CHLORIDE 0.9 % IV SOLN
5.0000 10*6.[IU] | Freq: Once | INTRAVENOUS | Status: AC
  Administered 2020-09-09: 5 10*6.[IU] via INTRAVENOUS
  Filled 2020-09-09: qty 5

## 2020-09-09 MED ORDER — LACTATED RINGERS IV SOLN
500.0000 mL | INTRAVENOUS | Status: DC | PRN
  Administered 2020-09-09: 500 mL via INTRAVENOUS

## 2020-09-09 MED ORDER — FENTANYL-BUPIVACAINE-NACL 0.5-0.125-0.9 MG/250ML-% EP SOLN: 12.0000 mL/h | EPIDURAL | Status: DC | PRN

## 2020-09-09 MED ORDER — COCONUT OIL OIL
1.0000 "application " | TOPICAL_OIL | Status: DC | PRN
  Administered 2020-09-11: 1 via TOPICAL

## 2020-09-09 MED ORDER — OXYTOCIN-SODIUM CHLORIDE 30-0.9 UT/500ML-% IV SOLN
2.5000 [IU]/h | INTRAVENOUS | Status: DC
  Filled 2020-09-09: qty 500

## 2020-09-09 MED ORDER — DIPHENHYDRAMINE HCL 50 MG/ML IJ SOLN: 12.5000 mg | INTRAMUSCULAR | Status: DC | PRN

## 2020-09-09 MED ORDER — OXYTOCIN BOLUS FROM INFUSION
333.0000 mL | Freq: Once | INTRAVENOUS | Status: DC
Start: 2020-09-09 — End: 2020-09-09

## 2020-09-09 MED ORDER — PRENATAL MULTIVITAMIN CH
1.0000 | ORAL_TABLET | Freq: Every day | ORAL | Status: DC
  Administered 2020-09-10 – 2020-09-11 (×2): 1 via ORAL
  Filled 2020-09-09 (×2): qty 1

## 2020-09-09 MED ORDER — IBUPROFEN 600 MG PO TABS
600.0000 mg | ORAL_TABLET | Freq: Four times a day (QID) | ORAL | Status: DC
  Administered 2020-09-10 – 2020-09-11 (×7): 600 mg via ORAL
  Filled 2020-09-09 (×7): qty 1

## 2020-09-09 MED ORDER — SENNOSIDES-DOCUSATE SODIUM 8.6-50 MG PO TABS
2.0000 | ORAL_TABLET | Freq: Every day | ORAL | Status: DC
  Administered 2020-09-10 – 2020-09-11 (×2): 2 via ORAL
  Filled 2020-09-09 (×2): qty 2

## 2020-09-09 MED ORDER — BENZOCAINE-MENTHOL 20-0.5 % EX AERO: 1.0000 "application " | INHALATION_SPRAY | CUTANEOUS | Status: DC | PRN

## 2020-09-09 MED ORDER — LACTATED RINGERS IV SOLN
INTRAVENOUS | Status: DC
Start: 2020-09-09 — End: 2020-09-09

## 2020-09-09 MED ORDER — ONDANSETRON HCL 4 MG PO TABS: 4.0000 mg | ORAL_TABLET | ORAL | Status: DC | PRN

## 2020-09-09 MED ORDER — OXYCODONE-ACETAMINOPHEN 5-325 MG PO TABS: 1.0000 | ORAL_TABLET | ORAL | Status: DC | PRN

## 2020-09-09 MED ORDER — DIBUCAINE (PERIANAL) 1 % EX OINT: 1.0000 "application " | TOPICAL_OINTMENT | CUTANEOUS | Status: DC | PRN

## 2020-09-09 MED ORDER — WITCH HAZEL-GLYCERIN EX PADS: 1.0000 "application " | MEDICATED_PAD | CUTANEOUS | Status: DC | PRN

## 2020-09-09 MED ORDER — ONDANSETRON HCL 4 MG/2ML IJ SOLN: 4.0000 mg | INTRAMUSCULAR | Status: DC | PRN

## 2020-09-09 MED ORDER — ACETAMINOPHEN 325 MG PO TABS: 650.0000 mg | ORAL_TABLET | ORAL | Status: DC | PRN

## 2020-09-09 MED ORDER — DIPHENHYDRAMINE HCL 25 MG PO CAPS: 25.0000 mg | ORAL_CAPSULE | Freq: Four times a day (QID) | ORAL | Status: DC | PRN

## 2020-09-09 MED ORDER — ZOLPIDEM TARTRATE 5 MG PO TABS: 5.0000 mg | ORAL_TABLET | Freq: Every evening | ORAL | Status: DC | PRN

## 2020-09-09 MED ORDER — TETANUS-DIPHTH-ACELL PERTUSSIS 5-2.5-18.5 LF-MCG/0.5 IM SUSY: 0.5000 mL | PREFILLED_SYRINGE | Freq: Once | INTRAMUSCULAR | Status: DC

## 2020-09-09 MED ORDER — ONDANSETRON HCL 4 MG/2ML IJ SOLN: 4.0000 mg | Freq: Four times a day (QID) | INTRAMUSCULAR | Status: DC | PRN

## 2020-09-09 MED ORDER — SOD CITRATE-CITRIC ACID 500-334 MG/5ML PO SOLN: 30.0000 mL | ORAL | Status: DC | PRN

## 2020-09-09 MED ORDER — LACTATED RINGERS IV SOLN: 500.0000 mL | Freq: Once | INTRAVENOUS | Status: DC

## 2020-09-09 MED ORDER — FENTANYL CITRATE (PF) 100 MCG/2ML IJ SOLN
100.0000 ug | INTRAMUSCULAR | Status: DC | PRN
  Administered 2020-09-09: 100 ug via INTRAVENOUS
  Filled 2020-09-09: qty 2

## 2020-09-09 MED ORDER — SIMETHICONE 80 MG PO CHEW: 80.0000 mg | CHEWABLE_TABLET | ORAL | Status: DC | PRN

## 2020-09-09 MED ORDER — PENICILLIN G POT IN DEXTROSE 60000 UNIT/ML IV SOLN
3.0000 10*6.[IU] | INTRAVENOUS | Status: DC
  Administered 2020-09-09 (×2): 3 10*6.[IU] via INTRAVENOUS
  Filled 2020-09-09 (×2): qty 50

## 2020-09-09 MED ORDER — OXYCODONE-ACETAMINOPHEN 5-325 MG PO TABS: 2.0000 | ORAL_TABLET | ORAL | Status: DC | PRN

## 2020-09-09 MED ORDER — OXYTOCIN 10 UNIT/ML IJ SOLN
INTRAMUSCULAR | Status: AC
  Administered 2020-09-09: 10 [IU]
  Filled 2020-09-09: qty 1

## 2020-09-09 NOTE — MAU Note (Signed)
Patient reports to MAU with vaginal bleeding and cramping that started about 30 minutes ago.  Says she noticed some blood in her underwear and then just recently put on pad so she is unsure of the amount.  +FM.

## 2020-09-09 NOTE — Discharge Summary (Addendum)
Postpartum Discharge Summary   Patient Name: Jessica Callahan DOB: [birth year] MRN: [id number]  Date of admission: 09/09/2020 Delivery date:09/09/2020  Delivering provider: Janet Berlin  Date of discharge: 09/11/2020  Admitting diagnosis: Vaginal bleeding in pregnancy, third trimester [O46.93] Intrauterine pregnancy: [redacted]w[redacted]d    Secondary diagnosis:  Active Problems:   Positive GBS test   Vaginal bleeding in pregnancy, third trimester   Postpartum anemia  Additional problems: Elevated BP    Discharge diagnosis: Term Pregnancy Delivered                                              Post partum procedures:none Augmentation: AROM Complications: None  Hospital course: Onset of Labor With Vaginal Delivery      25y.o. yo G1P0 at 376w1das admitted in Latent Labor on 09/09/2020. Patient had an uncomplicated labor course as follows:  Membrane Rupture Time/Date: 1:09 PM ,09/09/2020   Delivery Method:Vaginal, Spontaneous  Episiotomy: None  Lacerations:  2nd degree  Patient had a postpartum course remarkable for being started on po Fe due to PPD#1 Hgb of 8.8 (was 11.1 on admission). She was also started on Norvasc [id number] to elevated BPs in the [id number]. She is ambulating, tolerating a regular diet, passing flatus, and urinating well. Patient is discharged home in stable condition on 09/11/20.  Newborn Data: Birth date:09/09/2020  Birth time:5:26 PM  Gender:Female  Living status:Living  Apgars:9 ,9  [phone]  Magnesium Sulfate received: No BMZ received: No Rhophylac:N/A MMR:N/A T-DaP:Given prenatally Flu: No Transfusion:No  Physical exam  Vitals:   09/10/20 0838 09/10/20 1504 09/10/20 2055 09/11/20 0501  BP: 136/79 137/69 117/71 121/75  Pulse: 89 87 88 90  Resp: 16  18 16   Temp: 98 F (36.7 C)  98.3 F (36.8 C) 98.8 F (37.1 C)  TempSrc: Oral  Oral   SpO2: 99%  99% 99%   General: alert, cooperative and no distress Lochia: appropriate Uterine Fundus: firm Incision:  N/A DVT Evaluation: No evidence of DVT seen on physical exam. Labs: Lab Results  Component Value Date   WBC 20.8 (H) 09/10/2020   HGB 8.8 (L) 09/10/2020   HCT 25.1 (L) 09/10/2020   MCV 87.8 09/10/2020   PLT 172 09/10/2020   CMP Latest Ref Rng & Units 09/09/2020  Glucose 70 - 99 mg/dL 76  BUN 6 - 20 mg/dL 5(L)  Creatinine 0.44 - 1.00 mg/dL 0.58  Sodium 135 - 145 mmol/L 134(L)  Potassium 3.5 - 5.1 mmol/L 3.9  Chloride 98 - 111 mmol/L 105  CO2 22 - 32 mmol/L 20(L)  Calcium 8.9 - 10.3 mg/dL 8.6(L)  Total Protein 6.5 - 8.1 g/dL 6.0(L)  Total Bilirubin 0.3 - 1.2 mg/dL 0.9  Alkaline Phos 38 - 126 U/L 141(H)  AST 15 - 41 U/L 26  ALT 0 - 44 U/L 38   Edinburgh Score: No flowsheet data found.   After visit meds:  Allergies as of 09/11/2020   No Known Allergies     Medication List    STOP taking these medications   amoxicillin 500 MG capsule Commonly known as: AMOXIL     TAKE these medications   acetaminophen 500 MG tablet Commonly known as: TYLENOL Take 1,000 mg by mouth every 6 (six) hours as needed for mild pain, headache or moderate pain.   AMBULATORY NON FORMULARY MEDICATION 1 Device  by Other route once a week. Blood pressure cuff/Medium  Monitored Regularly at home. ICD 10: Z34.90 LROB   amLODipine 5 MG tablet Commonly known as: NORVASC Take 1 tablet (5 mg total) by mouth daily.   coconut oil Oil Apply 1 application topically as needed.   Ferrous Fumarate 324 (106 Fe) MG Tabs tablet Commonly known as: HEMOCYTE - 106 mg FE Take 1 tablet (106 mg of iron total) by mouth every other day. Start taking on: September 12, 2020   ibuprofen 600 MG tablet Commonly known as: ADVIL Take 1 tablet (600 mg total) by mouth every 6 (six) hours.   norethindrone 0.35 MG tablet Commonly known as: Ortho Micronor Take 1 tablet (0.35 mg total) by mouth daily.   prenatal multivitamin Tabs tablet Take 1 tablet by mouth daily at 12 noon.       Discharge home in stable  condition Infant Feeding: Breast Infant Disposition:home with mother Discharge instruction: per After Visit Summary and Postpartum booklet. Activity: Advance as tolerated. Pelvic rest for 6 weeks.  Diet: routine diet Future Appointments: Future Appointments  Date Time Provider [place]  09/15/2020  1:30 PM [facility] NURSE [facility] None  10/20/2020 10:35 AM Seabron Spates, CNM [facility] None   Follow up Visit: Please schedule this patient for an In person postpartum visit in 6 weeks with the following provider: Any provider. Additional Postpartum F/U: possible IUD Insertion at six weeks, BP check in one week   Low risk pregnancy complicated by: uncomplicated Delivery mode:  Vaginal, Spontaneous  Anticipated Birth Control:  Home with POPs, considering IUD at outpatient f/u    09/11/2020 Alcus Dad, MD  I saw and evaluated the patient. I agree with the findings and the plan of care as documented in the resident's note.  Sharene Skeans, MD [facility] Family Medicine Fellow, [facility] for [facility] [place]

## 2020-09-09 NOTE — H&P (Signed)
Jessica Callahan is a 25 y.o. female G1P0 at [redacted]w[redacted]d presenting for vaginal bleeding and contractions. She reports contractions are painful, occurring about 4 times per hour since early this morning.  She went to the bathroom and saw bright red bleeding when wiping so came to the hospital.  The contractions are becoming stronger and closer together since onset.  Pregnancy has been uncomplicated, pt planning a waterbirth. Class attending and certificate in chart.  Needs consent on admission but assessment of bleeding first before waterbirth is offered today.  Anatomy [place] on 04/22/20 at 19 weeks wnl with EFW 47%, AFI wnl, largest pocket 3.94  GBS positive  Nursing Staff Provider  Office Location [facility]  Dating   12 wk [place]  Language  English  Anatomy US  normal  Flu Vaccine  04/21/2020 Genetic Screen  NIPS:   Low Risk Female  AFP:      TDaP vaccine   08/25/2019 Hgb A1C or  GTT Early  Third trimester Normal 2 hr GTT  COVID vaccine  Recommended @NOB    LAB RESULTS   Rhogam   Blood Type AB/Positive/-- (08/24 1027) AB+  Feeding Plan Breast  Antibody Negative (08/24 1027)Neg  Contraception  Undecided maybe IUD PP Rubella 6.73 (08/24 1027)Immune  Circumcision Yes RPR Non Reactive (12/14 1002) NR  Pediatrician   HBsAg Negative (08/24 1027) Neg  Support Person Brandon(FOB) HCVAb Negative  Prenatal Classes  HIV Non Reactive (12/14 1002)   Neg  BTL Consent  GBS Positive/-- (02/08 1126)(For PCN allergy, check sensitivities)   VBAC Consent  Pap Release signed    Hgb Electro   Normal AA  BP Cuff  CF     SMA  Neg    Waterbirth  [ ]  Class [ ]  Consent [x]  CNM visit    Induction  [ ]  Orders Entered [ ] Foley Y/N   OB History    Gravida  1   Para      Term      Preterm      AB      Living        SAB      IAB      Ectopic      Multiple      Live Births             History reviewed. No pertinent past medical history. Past Surgical History:  Procedure Laterality Date  . right wrist surgery      Family History: family history is not on file. Social History:  reports that she has never smoked. She has never used smokeless tobacco. She reports that she does not drink alcohol and does not use drugs.     Maternal Diabetes: No Genetic Screening: Normal Maternal Ultrasounds/Referrals: Normal Fetal Ultrasounds or other Referrals:  None Maternal Substance Abuse:  No Significant Maternal Medications:  None Significant Maternal Lab Results:  Group B Strep positive Other Comments:  None  Review of Systems  Constitutional: Negative for chills, fatigue and fever.  Eyes: Negative for visual disturbance.  Respiratory: Negative for shortness of breath.   Cardiovascular: Negative for chest pain.  Gastrointestinal: Negative for abdominal pain and vomiting.  Genitourinary: Positive for vaginal bleeding. Negative for difficulty urinating, dysuria, flank pain, pelvic pain, vaginal discharge and vaginal pain.  Neurological: Negative for dizziness and headaches.  Psychiatric/Behavioral: Negative.    Maternal Medical History:  Reason for admission: Contractions and vaginal bleeding.   Contractions: Onset was 3-5 hours ago.   Frequency: regular.  Perceived severity is moderate.    Fetal activity: Perceived fetal activity is normal.    Prenatal complications: no prenatal complications Prenatal Complications - Diabetes: none.    Dilation: 3.5 Effacement (%): 50 Station: -3 Exam by:: Judeth Horn NP Blood pressure 134/88, pulse 73, temperature 98.2 F (36.8 C), temperature source Oral, resp. rate 18, last menstrual period 12/10/2019, SpO2 100 %. Maternal Exam:  Uterine Assessment: Contraction strength is moderate.  Contraction frequency is regular.   Abdomen: Fetal presentation: vertex  Cervix: Cervix evaluated by digital exam.     Fetal Exam Fetal Monitor Review: Mode: ultrasound.   Baseline rate: 135.  Variability: moderate (6-25 bpm).   Pattern: accelerations present.     Fetal State Assessment: Category I - tracings are normal.     Physical Exam Vitals and nursing note reviewed.  Constitutional:      Appearance: She is well-developed and well-nourished.  Cardiovascular:     Rate and Rhythm: Normal rate and regular rhythm.     Heart sounds: Normal heart sounds.  Pulmonary:     Effort: Pulmonary effort is normal.     Breath sounds: Normal breath sounds.  Abdominal:     Palpations: Abdomen is soft.  Musculoskeletal:        General: Normal range of motion.     Cervical back: Normal range of motion.  Skin:    General: Skin is warm and dry.  Neurological:     Mental Status: She is alert and oriented to person, place, and time.  Psychiatric:        Mood and Affect: Mood and affect normal.        Behavior: Behavior normal.        Thought Content: Thought content normal.        Judgment: Judgment normal.     Prenatal labs: ABO, Rh: AB/Positive/-- (08/24 1027) Antibody: Negative (08/24 1027) Rubella: 6.73 (08/24 1027) RPR: Non Reactive (12/14 1002)  HBsAg: Negative (08/24 1027)  HIV: Non Reactive (12/14 1002)  GBS: Positive/-- (02/08 1126)   Assessment/Plan: G1P0 at [redacted]w[redacted]d admitted for vaginal bleeding/early labor GBS positive Isolated elevated BP  Admit to L&D Will observe bleeding/pad count on admission PCN for GBS prophylaxis PEC labs drawn on admission, watch BP  If bleeding is consistent with cervical change and remains light, and blood pressure is isolated and does not meet criteria for GHTN, will consider using intermittent FHR monitoring and filling tub for patient to use during labor/birth.  Pt aware that waterbirth is only available for low risk labor and that labor is unpredictable.      Sharen Counter 09/09/2020, 3:56 AM

## 2020-09-09 NOTE — Progress Notes (Signed)
Jessica Callahan is a 25 y.o. G1P0 at [redacted]w[redacted]d by ultrasound admitted for active labor/vaginal bleeding.  Subjective: Pt breathing with contractions, s/o in room for support.  Objective: BP 133/85   Pulse 94   Temp 98.1 F (36.7 C) (Oral)   Resp 18   LMP 12/10/2019 (Exact Date)   SpO2 100%  No intake/output data recorded. No intake/output data recorded.  FHT:  FHR: 135 bpm, variability: moderate,  accelerations:  Present,  decelerations:  Absent UC:   regular, every 3 minutes, mild to moderate to palpation SVE:   Dilation: 5 Effacement (%): 100 Station: -2 Exam by:: Misty Stanley CNM Bleeding on pad is scant/minimal at this time  Labs: Lab Results  Component Value Date   WBC 16.1 (H) 09/09/2020   HGB 11.1 (L) 09/09/2020   HCT 34.2 (L) 09/09/2020   MCV 90.5 09/09/2020   PLT 187 09/09/2020    Assessment / Plan: Spontaneous labor, progressing normally  Labor: Progressing normally No barriers to getting in tub for hydrotherapy/waterbirth at this time.  Preeclampsia:  n/a Fetal Wellbeing:  Category I Pain Control:  none I/D:  GBS positive on PCN Anticipated MOD:  NSVD  Sharen Counter 09/09/2020, 7:53 AM

## 2020-09-09 NOTE — MAU Provider Note (Signed)
History     CSN: [id number]  Arrival date and time: 09/09/20 0259   Event Date/Time   First Provider Initiated Contact with Patient 09/09/20 0315      Chief Complaint  Patient presents with  . Vaginal Bleeding  . Abdominal Pain    Cramping   HPI Jessica Callahan is a 25 y.o. G1P0 at 85w1dwho presents with vaginal bleeding & abdominal cramping. Symptoms started just prior to arrival. Reports bright red blood on toilet paper. Immediately put on a pad and came to the hospital. Also reports some painful contractions that occur more than 4 times per hour. Denies LOF. Denies abdominal trauma or fall. Had intercourse Monday. Reports positive fetal movement.  Denies history of hypertension, headache, visual disturbance, or epigastric pain.   OB History    Gravida  1   Para      Term      Preterm      AB      Living        SAB      IAB      Ectopic      Multiple      Live Births              History reviewed. No pertinent past medical history.  Past Surgical History:  Procedure Laterality Date  . right wrist surgery      History reviewed. No pertinent family history.  Social History   Tobacco Use  . Smoking status: Never Smoker  . Smokeless tobacco: Never Used  Substance Use Topics  . Alcohol use: Never  . Drug use: Never    Allergies: No Known Allergies  Medications Prior to Admission  Medication Sig Dispense Refill Last Dose  . Prenatal Vit-Fe Fumarate-FA (PRENATAL VITAMINS PO) Take by mouth.   09/08/2020 at Unknown time  . AMBULATORY NON FORMULARY MEDICATION 1 Device by Other route once a week. Blood pressure cuff/Medium  Monitored Regularly at home. ICD 10: Z34.90 LROB (Patient not taking: No sig reported) 1 kit 0     Review of Systems  Constitutional: Negative.   Gastrointestinal: Positive for abdominal pain.  Genitourinary: Positive for vaginal bleeding.   Physical Exam   Blood pressure 134/88, pulse 73, temperature 98.2 F (36.8 C),  temperature source Oral, resp. rate 18, last menstrual period 12/10/2019, SpO2 100 %.  Physical Exam Vitals and nursing note reviewed. Exam conducted with a chaperone present.  Constitutional:      General: She is not in acute distress.    Appearance: She is well-developed.  HENT:     Head: Normocephalic and atraumatic.  Pulmonary:     Effort: Pulmonary effort is normal. No respiratory distress.  Abdominal:     Palpations: Abdomen is soft.  Genitourinary:    General: Normal vulva.     Exam position: Lithotomy position.     Vagina: Bleeding present.     Comments: Small amount of thin bright red blood coming from os.   Dilation: 3.5 Effacement (%): 50 Cervical Position: Posterior Station: -3 Presentation: Vertex Exam by:: EJorje GuildNP  Neurological:     Mental Status: She is alert.    NST:  Baseline: 150 bpm, Variability: Good {> 6 bpm), Accelerations: Reactive and Decelerations: Absent  MAU Course  Procedures No results found for this or any previous visit (from the past 24 hour(s)).  MDM Patient is 39+ weeks with bleeding from unknown source. Will send to birthing suites. Patient's intake BP was elevated; no hx  or symptoms. May be due to stress of coming to the hospital. Will cycle BPs & collect preeclampsia labs.    Assessment and Plan   1. Vaginal bleeding in pregnancy, third trimester    -admit to birthing suites  Jorje Guild 09/09/2020, 3:30 AM

## 2020-09-09 NOTE — Lactation Note (Signed)
This note was copied from a baby's chart. Lactation Consultation Note  Patient Name: Boy Anagha Loseke [id number] Date: 09/09/2020 Reason for consult: L&D Initial assessment;Term Age:24 hours  LC went in to work with Mom. At the time of the visit, Mom under the care of RN for vaginal bleeding. LC will return once Mom is back in bed.   Maternal Data Has patient been taught Hand Expression?: Yes Does the patient have breastfeeding experience prior to this delivery?: No  Feeding Mother's Current Feeding Choice: Breast Milk  LATCH Score Latch: Repeated attempts needed to sustain latch, nipple held in mouth throughout feeding, stimulation needed to elicit sucking reflex.  Audible Swallowing: A few with stimulation  Type of Nipple: Everted at rest and after stimulation  Comfort (Breast/Nipple): Soft / non-tender  Hold (Positioning): Assistance needed to correctly position infant at breast and maintain latch.  LATCH Score: 7   Lactation Tools Discussed/Used    Interventions Interventions: Breast feeding basics reviewed;Assisted with latch;Skin to skin;Breast massage;Support pillows;Adjust position  Discharge Pump: Manual;Personal (Mom has a hand pump at home.) [facility] Program: Yes  Consult Status Consult Status: Follow-up Date: 09/10/20 Follow-up type: In-patient    Stony Stegmann  [name] 09/09/2020, 9:27 PM

## 2020-09-09 NOTE — Lactation Note (Signed)
This note was copied from a baby's chart. Lactation Consultation Note  Patient Name: Jessica Callahan [id number] Date: 09/09/2020 Reason for consult: L&D Initial assessment;Term Age:24 hours  (L&D - no charge for [facility] services) LC entered the room, mom was dong STS and infant was cuing to breastfeed. LC noticed mom is short shafted, LC ask mom to do breast stimulation and hand express small amount of colostrum out which help everted her nipple shaft more. Infant latched on mom's left breast using the football hold position, infant was on and off the breast ( not sustaining  latch)  for 8 minutes. Infant had 1st stool while feeding L&D RN changed diaper. Mom was taught hand expression and infant was given 4 mls of colostrum by spoon. LC placed hand pump in patient's room for mom  to pre-pump breast prior to latching infant due to  mom having  short shafted nipples. LC discussed with [facility] (RN)  1st shift to review how to use hand pump with mom.  Mom knows to call RN or LC on MBU  for latch assistance if needed.  LC discussed infant's input and out put with parents.  Maternal Data Has patient been taught Hand Expression?: Yes Does the patient have breastfeeding experience prior to this delivery?: No  Feeding Mother's Current Feeding Choice: Breast Milk  LATCH Score Latch: Repeated attempts needed to sustain latch, nipple held in mouth throughout feeding, stimulation needed to elicit sucking reflex.  Audible Swallowing: A few with stimulation  Type of Nipple: Everted at rest and after stimulation (Mom is short shafted and understand to pre-pump breast with hand pump prior to latching infant to help evert nipple shaft out more.)  Comfort (Breast/Nipple): Soft / non-tender  Hold (Positioning): Assistance needed to correctly position infant at breast and maintain latch.  LATCH Score: 7   Lactation Tools Discussed/Used    Interventions Interventions: Breast feeding basics reviewed;Assisted  with latch;Skin to skin;Hand express;Pre-pump if needed;Breast compression;Adjust position;Support pillows;Position options;Expressed milk;Hand pump;Education  Discharge Pump: Manual;Personal (Mom has a hand pump at home.) [facility] Program: Yes  Consult Status Consult Status: Follow-up Date: 09/10/20 Follow-up type: In-patient    Danelle Earthly 09/09/2020, 6:48 PM

## 2020-09-09 NOTE — Progress Notes (Signed)
   Maricel Swartzendruber is a 25 y.o. G1P0 at [redacted]w[redacted]d  admitted for SOL.   Subjective: Coping well, in the tub  Objective: Vitals:   09/09/20 0700 09/09/20 0705 09/09/20 0812 09/09/20 0907  BP:  133/85 123/74 122/75  Pulse:  94 80 78  Resp: 20 18    Temp:      TempSrc:      SpO2:       No intake/output data recorded.  FHT:  FHR: 140 bpm, variability: moderate,  accelerations:  Present,  decelerations:  Absent UC:   irregular, every 5 minutes SVE:   Dilation: 5 Effacement (%): 100 Station: -2 Exam by:: [employer]: Lab Results  Component Value Date   WBC 16.1 (H) 09/09/2020   HGB 11.1 (L) 09/09/2020   HCT 34.2 (L) 09/09/2020   MCV 90.5 09/09/2020   PLT 187 09/09/2020    Assessment / Plan: Spontaneous labor, progressing normally  Labor: Progressing normally Fetal Wellbeing:  Category I Pain Control:  Labor support without medications Anticipated MOD:  NSVD  Marylene Land 09/09/2020, 9:57 AM

## 2020-09-09 NOTE — Progress Notes (Signed)
    Jessica Callahan is a 25 y.o. G1P0 at [redacted]w[redacted]d  admitted for active labor  Subjective: Coping well, has been on birthing ball   Objective: Vitals:   09/09/20 0907 09/09/20 0937 09/09/20 1025 09/09/20 1201  BP: 122/75 116/79 115/75 119/78  Pulse: 78 71  77  Resp:      Temp:      TempSrc:      SpO2:       No intake/output data recorded.  FHT:  FHR: 140 bpm, variability: moderate,  accelerations:  Present,  decelerations:  Absent UC:   irregular,  SVE:   Dilation: 5 Effacement (%): 100 Station: -2 Exam by:: Mikella Linsley,cnm   Labs: Lab Results  Component Value Date   WBC 16.1 (H) 09/09/2020   HGB 11.1 (L) 09/09/2020   HCT 34.2 (L) 09/09/2020   MCV 90.5 09/09/2020   PLT 187 09/09/2020    Patient Vitals for the past 24 hrs:  BP Temp Temp src Pulse Resp SpO2  09/09/20 1315 127/80 -- -- 81 16 --  09/09/20 1201 119/78 -- -- 77 -- --  09/09/20 1025 115/75 -- -- -- -- --  09/09/20 0937 116/79 -- -- 71 -- --  09/09/20 0907 122/75 -- -- 78 -- --  09/09/20 0812 123/74 -- -- 80 -- --  09/09/20 0705 133/85 -- -- 94 18 --  09/09/20 0700 -- -- -- -- 20 --  09/09/20 0607 126/75 -- -- 71 18 --  09/09/20 0508 117/76 -- -- 82 19 --  09/09/20 0422 137/87 98.1 F (36.7 C) Oral 70 18 --  09/09/20 0400 135/78 -- -- 78 -- 100 %  09/09/20 0345 128/70 -- -- 74 -- 99 %  09/09/20 0330 130/73 -- -- 71 -- 99 %  09/09/20 0325 -- -- -- -- -- 100 %  09/09/20 0321 -- -- -- -- -- 100 %  09/09/20 0315 134/88 -- -- 73 18 100 %  09/09/20 0310 -- -- -- -- -- 100 %  09/09/20 0307 (!) 143/93 98.2 F (36.8 C) Oral 78 -- --      Assessment / Plan: Spontaneous labor, patient has not made change since this morning so AROM performed, small amoutn of bloody fluid -pre-e labs normal, no signs/symptoms of pre-e and vitals are all normal since admission.   Labor: doing well, no change since this morning so AROM performed.  Fetal Wellbeing:  Category I Pain Control:  Labor support without  medications Anticipated MOD:  NSVD  Marylene Land 09/09/2020, 1:16 PM

## 2020-09-09 NOTE — Lactation Note (Signed)
This note was copied from a baby's chart. Lactation Consultation Note  Patient Name: Jessica Callahan Date: 09/09/2020 Reason for consult: Initial assessment;Mother's request;Primapara;1st time breastfeeding;Term Age:24 hours Infant fed prior to visit for 20 minutes. LC not able to assess a latch as a result.  Mom flat nipples with areola edema. Reverse pressure softening and use of the manual to prepump before latching reviewed with Mother. LC went over feeding cues, keeping infant STS and offering both breasts during a feed.  Plan 1. To feed based on cues 8-12x in 24hr period no more than 3 hrs without an attempt.           2. Manual pump for pre pumping to elongate nipple before latching. (5-10 minutes on each breasts)           3. I and O sheet reviewed            4. LC brochure of inpatient and outpatient services reviewed.   Mom to call RN or [facility] for assistance with the next feeding.  Maternal Data Has patient been taught Hand Expression?: Yes Does the patient have breastfeeding experience prior to this delivery?: No  Feeding Mother's Current Feeding Choice: Breast Milk  LATCH Score Latch: Repeated attempts needed to sustain latch, nipple held in mouth throughout feeding, stimulation needed to elicit sucking reflex.  Audible Swallowing: A few with stimulation  Type of Nipple: Everted at rest and after stimulation  Comfort (Breast/Nipple): Soft / non-tender  Hold (Positioning): Assistance needed to correctly position infant at breast and maintain latch.  LATCH Score: 7   Lactation Tools Discussed/Used Tools: Pump;Flanges Flange Size: 24 Breast pump type: Manual Pump Education: Setup, frequency, and cleaning;Milk Storage Reason for Pumping: increase stimulation, nipples are flat with areola edema Pumping frequency: every 3 hrs for 5-10 minutes before latching  Interventions Interventions: Breast feeding basics reviewed;Hand pump;Skin to skin;Breast  massage;Position options;Hand express;Expressed milk;Education;Pre-pump if needed;Reverse pressure  Discharge Pump: Manual  Consult Status Consult Status: Follow-up Date: 09/10/20 Follow-up type: In-patient    Jessica Callahan  Jessica Callahan 09/09/2020, 10:55 PM

## 2020-09-10 ENCOUNTER — Encounter: Payer: No Typology Code available for payment source | Admitting: Family Medicine

## 2020-09-10 DIAGNOSIS — O9081 Anemia of the puerperium: Secondary | ICD-10-CM | POA: Diagnosis not present

## 2020-09-10 LAB — CBC
HCT: 25.1 % — ABNORMAL LOW (ref 36.0–46.0)
Hemoglobin: 8.8 g/dL — ABNORMAL LOW (ref 12.0–15.0)
MCH: 30.8 pg (ref 26.0–34.0)
MCHC: 35.1 g/dL (ref 30.0–36.0)
MCV: 87.8 fL (ref 80.0–100.0)
Platelets: 172 10*3/uL (ref 150–400)
RBC: 2.86 MIL/uL — ABNORMAL LOW (ref 3.87–5.11)
RDW: 13.8 % (ref 11.5–15.5)
WBC: 20.8 10*3/uL — ABNORMAL HIGH (ref 4.0–10.5)
nRBC: 0 % (ref 0.0–0.2)

## 2020-09-10 MED ORDER — AMLODIPINE BESYLATE 5 MG PO TABS
5.0000 mg | ORAL_TABLET | Freq: Every day | ORAL | Status: DC
  Administered 2020-09-10 – 2020-09-11 (×2): 5 mg via ORAL
  Filled 2020-09-10 (×2): qty 1

## 2020-09-10 MED ORDER — FERROUS FUMARATE 324 (106 FE) MG PO TABS
1.0000 | ORAL_TABLET | ORAL | Status: DC
  Administered 2020-09-10: 106 mg via ORAL
  Filled 2020-09-10: qty 1

## 2020-09-10 NOTE — Progress Notes (Signed)
Post Partum Day #1 Subjective: no complaints, up ad lib and tolerating PO; breastfeeding going well; plans on POPs for contraception; would like for her son to have a circumcision- consent reviewed and note placed in his chart; denies s/s anemia  Objective:  BP@MN  141/80 Blood pressure 120/75, pulse 73, temperature 98.7 F (37.1 C), temperature source Oral, resp. rate 16, last menstrual period 12/10/2019, SpO2 100 %, unknown if currently breastfeeding.  Physical Exam:  General: alert, cooperative and no distress Lochia: appropriate Uterine Fundus: firm DVT Evaluation: No evidence of DVT seen on physical exam.  Recent Labs    09/09/20 0410 09/10/20 0535  HGB 11.1* 8.8*  HCT 34.2* 25.1*    Assessment/Plan: Plan for discharge tomorrow and Circumcision prior to discharge  Postpartum anemia (delivery EBL only 75cc)>asymptomatic>start QOD po Fe Watch BPs- single elevation overnight with neg pre-e labs>asymptomatic    LOS: 1 day   Jessica Callahan CNM 09/10/2020, 7:47 AM

## 2020-09-10 NOTE — Lactation Note (Signed)
This note was copied from a baby's chart. Lactation Consultation Note  Patient Name: Boy Cristiana Yochim [id number] Date: 09/10/2020 Reason for consult: Follow-up assessment Age:24 hours   P1 mother whose infant is now 48 hours old.  This is a term baby at 39+1 weeks.  Baby was asleep in mother's arms when I arrived.  Mother had some basic breast feeding questions which I answered to her satisfaction.  Mother is familiar with hand expression and reviewed the importance of breast massage and hand expression.  Mother will spoon feed any EBM she obtains to baby.  Mother concerned that baby is not feeding for long periods.  Reassured mother that this is typical for an 26 hour old infant.  Discussed progression with feedings.  Mother stated that baby is showing cues.  She knows to call her RN/LC for latch assistance as needed.   Maternal Data    Feeding    LATCH Score Latch: Repeated attempts needed to sustain latch, nipple held in mouth throughout feeding, stimulation needed to elicit sucking reflex.  Audible Swallowing: A few with stimulation  Type of Nipple: Flat  Comfort (Breast/Nipple): Soft / non-tender  Hold (Positioning): Assistance needed to correctly position infant at breast and maintain latch.  LATCH Score: 6   Lactation Tools Discussed/Used    Interventions Interventions: Education  Discharge    Consult Status Consult Status: Follow-up Date: 09/11/20 Follow-up type: In-patient    Dora Sims 09/10/2020, 12:19 PM

## 2020-09-11 ENCOUNTER — Other Ambulatory Visit: Payer: Self-pay | Admitting: Family Medicine

## 2020-09-11 MED ORDER — NORETHINDRONE 0.35 MG PO TABS: 1.0000 | ORAL_TABLET | Freq: Every day | ORAL | 2 refills | Status: DC

## 2020-09-11 MED ORDER — IBUPROFEN 600 MG PO TABS: 600.0000 mg | ORAL_TABLET | Freq: Four times a day (QID) | ORAL | Status: DC

## 2020-09-11 MED ORDER — AMLODIPINE BESYLATE 5 MG PO TABS: 5.0000 mg | ORAL_TABLET | Freq: Every day | ORAL | 0 refills | Status: DC

## 2020-09-11 MED ORDER — COCONUT OIL OIL: 1.0000 "application " | TOPICAL_OIL | 0 refills | Status: DC | PRN

## 2020-09-11 MED ORDER — FERROUS FUMARATE 324 (106 FE) MG PO TABS: 1.0000 | ORAL_TABLET | ORAL | 0 refills | Status: DC

## 2020-09-11 MED FILL — FERROUS SULFATE 325 MG TAB: 325 (65 FE) | 60 days supply | Qty: 30 | Fill #0

## 2020-09-11 MED FILL — AMLODIPINE BESYLATE 5 MG TA: 5 | 30 days supply | Qty: 30 | Fill #0

## 2020-09-11 MED FILL — NORETHINDRONE 0.35 MG TAB: 0.35 | 28 days supply | Qty: 28 | Fill #0

## 2020-09-11 NOTE — Plan of Care (Signed)
  Problem: Activity: Goal: Risk for activity intolerance will decrease Outcome: Progressing   Problem: Coping: Goal: Level of anxiety will decrease Outcome: Progressing   Problem: Pain Managment: Goal: General experience of comfort will improve Outcome: Progressing   Problem: Safety: Goal: Ability to remain free from injury will improve Outcome: Progressing   

## 2020-09-11 NOTE — Lactation Note (Addendum)
This note was copied from a baby's chart. Lactation Consultation Note  Patient Name: Jessica Callahan [id number] Date: 09/11/2020 Reason for consult: Follow-up assessment;Mother's request;Difficult latch;Primapara;1st time breastfeeding;Term;Nipple pain/trauma;Hyperbilirubinemia Age: 24 hours  Mom latching with 20 NS shallow. Nipple trauma, compression and abrasions on both nipples. LC got Mom to provide coconut oil for nipple care. 24 flange comfortable fit for Mom. 20 NS hitting side of abrasions and increased to 24. Infant stopped feeding by the time I brought her the 24 NS to evaluate if larger shield decreased pain with latch.  Infant did some suck training to ensure lips are flanged and latch is wide and not shallow on NS. I can feel base of the tongue during latch on my finger.  LC improved latch putting infant in football with Mom semi reclined to keep him off the edge of the nipple and get a deeper latch. Infant transferring well NS filled with colostrum once he was de-latched.  Plan 1. Feed based on cues 8-12x in 24 hr period no more than 4 hrs without an attempt. Mom to try latching at breasts then use 24 NS. Mom pre pump to get nipples out more before latching.        2. Manual pump q 3hrs 10 minutes. Any EBM Mom able to offer via spoon/ finger feeding.           3. Signs, symptoms, treatment and engorgement education provided.            4 LC brochure of inpatient and outpatient services discussed.  LC talked with Mom about how to get a WIC pump loaner for home prior to discharge. Mom to discuss with Dad. Mom in communication with WIC.   LC will send an email to Outpatient clinic to follow up with Mom given her nipple trauma and risk for hyperbilirubinemia.   Maternal Data    Feeding Mother's Current Feeding Choice: Breast Milk  LATCH Score Latch: Repeated attempts needed to sustain latch, nipple held in mouth throughout feeding, stimulation needed to elicit sucking  reflex.  Audible Swallowing: Spontaneous and intermittent  Type of Nipple: Everted at rest and after stimulation (some nipple trauma from shallow latching on 20 NS, short shafted)  Comfort (Breast/Nipple): Filling, red/small blisters or bruises, mild/mod discomfort  Hold (Positioning): Assistance needed to correctly position infant at breast and maintain latch.  LATCH Score: 7   Lactation Tools Discussed/Used Tools: Pump;Flanges;Coconut oil Nipple shield size: 24 Flange Size: 24 Breast pump type: Manual  Interventions Interventions: Breast feeding basics reviewed;Breast compression;Assisted with latch;Adjust position;Skin to skin;Support pillows;Breast massage;Hand express;Expressed milk;Coconut oil;Hand pump;Position options;Education  Discharge Discharge Education: Engorgement and breast care;Warning signs for feeding baby;Outpatient recommendation;Outpatient Epic message sent  Consult Status Consult Status: Follow-up Date: 09/12/20 Follow-up type: In-patient    Jessica Callahan  Jessica Callahan 09/11/2020, 12:20 PM

## 2020-09-15 ENCOUNTER — Ambulatory Visit (INDEPENDENT_AMBULATORY_CARE_PROVIDER_SITE_OTHER): Payer: No Typology Code available for payment source

## 2020-09-15 VITALS — BP 119/76 | HR 82 | Wt 157.0 lb

## 2020-09-15 DIAGNOSIS — Z0130 Encounter for examination of blood pressure without abnormal findings: Secondary | ICD-10-CM

## 2020-09-15 NOTE — Progress Notes (Addendum)
Subjective:  Jessica Callahan is a 25 y.o. female here for BP check.   Hypertension ROS: taking medications as instructed, no medication side effects noted, no TIA's, no chest pain on exertion, no dyspnea on exertion and no swelling of ankles.    Objective:  BP 119/76   Pulse 82   Wt 157 lb (71.2 kg)   BMI 27.81 kg/m   Appearance alert, well appearing, and in no distress. General exam BP noted to be well controlled today in office.    Assessment:   Blood Pressure well controlled.   Plan:  Current treatment plan is effective, no change in therapy. Pt advised to continue taking Norvasc 5 mg and to follow up at [facility] visit or sooner if needed. [address] l Brissa Asante, CMA  Patient was assessed and managed by nursing staff during this encounter. I have reviewed the chart and agree with the documentation and plan. I have also made any necessary editorial changes.  Wynelle Bourgeois, CNM 09/15/2020 10:11 PM

## 2020-10-20 ENCOUNTER — Other Ambulatory Visit (HOSPITAL_BASED_OUTPATIENT_CLINIC_OR_DEPARTMENT_OTHER): Payer: Self-pay

## 2020-10-20 ENCOUNTER — Ambulatory Visit (INDEPENDENT_AMBULATORY_CARE_PROVIDER_SITE_OTHER): Payer: No Typology Code available for payment source | Admitting: Advanced Practice Midwife

## 2020-10-20 ENCOUNTER — Encounter: Payer: Self-pay | Admitting: Advanced Practice Midwife

## 2020-10-20 ENCOUNTER — Other Ambulatory Visit: Payer: Self-pay

## 2020-10-20 MED ORDER — MEDROXYPROGESTERONE ACETATE 150 MG/ML IM SUSY
150.0000 mg | PREFILLED_SYRINGE | INTRAMUSCULAR | 3 refills | Status: DC
  Filled 2020-10-20: qty 1, 90d supply, fill #0

## 2020-10-20 MED ORDER — MEDROXYPROGESTERONE ACETATE 150 MG/ML IM SUSP: 150.0000 mg | INTRAMUSCULAR | 0 refills | Status: DC

## 2020-10-20 NOTE — Patient Instructions (Signed)
Medroxyprogesterone injection [Contraceptive] What is this medicine? MEDROXYPROGESTERONE (me DROX ee proe JES te rone) contraceptive injections prevent pregnancy. They provide effective birth control for 3 months. Depo-SubQ Provera 104 injection is also used for treating pain related to endometriosis. This medicine may be used for other purposes; ask your health care provider or pharmacist if you have questions. COMMON BRAND NAME(S): Depo-Provera, Depo-subQ Provera 104 What should I tell my health care provider before I take this medicine? They need to know if you have any of these conditions:  asthma  blood clots  breast cancer or family history of breast cancer  depression  diabetes  eating disorder (anorexia nervosa)  heart attack  high blood pressure  HIV infection or AIDS  if you often drink alcohol  kidney disease  liver disease  migraine headaches  osteoporosis, weak bones  seizures  stroke  tobacco smoker  vaginal bleeding  an unusual or allergic reaction to medroxyprogesterone, other hormones, medicines, foods, dyes, or preservatives  pregnant or trying to get pregnant  breast-feeding How should I use this medicine? Depo-Provera CI contraceptive injection is given into a muscle. Depo-subQ Provera 104 injection is given under the skin. It is given by a health care provider in a hospital or clinic setting. The injection is usually given during the first 5 days after the start of a menstrual period or 6 weeks after delivery of a baby. A patient package insert for the product will be given with each prescription and refill. Be sure to read this information carefully each time. The sheet may change often. Talk to your pediatrician regarding the use of this medicine in children. Special care may be needed. These injections have been used in female children who have started having menstrual periods. Overdosage: If you think you have taken too much of this  medicine contact a poison control center or emergency room at once. NOTE: This medicine is only for you. Do not share this medicine with others. What if I miss a dose? Keep appointments for follow-up doses. You must get an injection once every 3 months. It is important not to miss your dose. Call your health care provider if you are unable to keep an appointment. What may interact with this medicine?  antibiotics or medicines for infections, especially rifampin and griseofulvin  antivirals for HIV or hepatitis  aprepitant  armodafinil  bexarotene  bosentan  medicines for seizures like carbamazepine, felbamate, oxcarbazepine, phenytoin, phenobarbital, primidone, topiramate  mitotane  modafinil  St. John's wort This list may not describe all possible interactions. Give your health care provider a list of all the medicines, herbs, non-prescription drugs, or dietary supplements you use. Also tell them if you smoke, drink alcohol, or use illegal drugs. Some items may interact with your medicine. What should I watch for while using this medicine? This drug does not protect you against HIV infection (AIDS) or other sexually transmitted diseases. Use of this product may cause you to lose calcium from your bones. Loss of calcium may cause weak bones (osteoporosis). Only use this product for more than 2 years if other forms of birth control are not right for you. The longer you use this product for birth control the more likely you will be at risk for weak bones. Ask your health care professional how you can keep strong bones. You may have a change in bleeding pattern or irregular periods. Many females stop having periods while taking this drug. If you have received your injections on time, your   chance of being pregnant is very low. If you think you may be pregnant, see your health care professional as soon as possible. Tell your health care professional if you want to get pregnant within the  next year. The effect of this medicine may last a long time after you get your last injection. What side effects may I notice from receiving this medicine? Side effects that you should report to your doctor or health care professional as soon as possible:  allergic reactions like skin rash, itching or hives, swelling of the face, lips, or tongue  blood clot (chest pain; shortness of breath; pain, swelling, or warmth in the leg)  breast tenderness or discharge  changes in emotions or moods  changes in vision  liver injury (dark yellow or brown urine; general ill feeling or flu-like symptoms; loss of appetite, right upper belly pain; unusually weak or tired, yellowing of the eyes or skin)  persistent pain, pus, or bleeding at the injection site  stroke (changes in vision; confusion; trouble speaking or understanding; severe headaches; sudden numbness or weakness of the face, arm or leg; trouble walking; dizziness; loss of balance or coordination)  trouble breathing Side effects that usually do not require medical attention (report to your doctor or health care professional if they continue or are bothersome):  change in sex drive  dizziness  fluid retention  headache  irregular periods, spotting, or absent periods  pain, redness, or irritation at site where injected  stomach pain  weight gain This list may not describe all possible side effects. Call your doctor for medical advice about side effects. You may report side effects to FDA at 1-800-FDA-1088. Where should I keep my medicine? This injection is only given by a health care provider. It will not be stored at home. NOTE: This sheet is a summary. It may not cover all possible information. If you have questions about this medicine, talk to your doctor, pharmacist, or health care provider.  2021 Elsevier/Gold Standard (2019-08-14 10:29:21)  

## 2020-10-20 NOTE — Progress Notes (Signed)
    Post Partum Visit Note  Jessica Callahan is a 25 y.o. G6P1001 female who presents for a postpartum visit. She is 5 weeks postpartum following a normal spontaneous vaginal delivery.  I have fully reviewed the prenatal and intrapartum course. The delivery was at 39.1 gestational weeks.  Anesthesia: none. Postpartum course has been uneventful. Baby is doing well. Baby is feeding by breast. Bleeding no bleeding. Bowel function is normal. Bladder function is normal. Patient is not sexually active. Contraception method is Depo-Provera injections. Postpartum depression screening: negative. Score 5   The pregnancy intention screening data noted above was reviewed. Potential methods of contraception were discussed. The patient elected to proceed with Hormonal Injection.      The following portions of the patient's history were reviewed and updated as appropriate: allergies, current medications, past family history, past medical history, past social history, past surgical history and problem list.  Review of Systems Pertinent items are noted in HPI.  Objective:  There were no vitals taken for this visit.   General:  alert, cooperative and no distress   Breasts:  inspection negative, no nipple discharge or bleeding, no masses or nodularity palpable  Lungs: clear to auscultation bilaterally  Heart:  regular rate and rhythm, S1, S2 normal, no murmur, click, rub or gallop  Abdomen: soft, non-tender; bowel sounds normal; no masses,  no organomegaly   Vulva:  not evaluated  Vagina: not evaluated  Cervix:  not eval  Corpus: not examined  Adnexa:  not evaluated  Rectal Exam: Not performed.        Assessment:    Normal postpartum exam.  Totally thrilled with waterbirth and in love with her baby.  Feeding going well.   Pap smear not done at today's visit.   Plan:   Essential components of care per ACOG recommendations:  1.  Mood and well being: Patient with negative depression screening today.  Reviewed local resources for support.  - Patient does not use tobacco.  - hx of drug use? No    2. Infant care and feeding:  -Patient currently breastmilk feeding? Yes If breastmilk feeding discussed return to work and pumping. If needed, patient was provided letter for work to allow for every 2-3 hr pumping breaks, and to be granted a private location to express breastmilk and refrigerated area to store breastmilk. Reviewed importance of draining breast regularly to support lactation. -Social determinants of health (SDOH) reviewed in EPIC. No concerns 3. Sexuality, contraception and birth spacing - Patient does not want a pregnancy in the next year.  Desired family size is undecided  children.  - Reviewed forms of contraception in tiered fashion. Patient desired Depo-Provera today.   - Discussed birth spacing of 18 months  4. Sleep and fatigue -Encouraged family/partner/community support of 4 hrs of uninterrupted sleep to help with mood and fatigue  5. Physical Recovery  - Discussed patients delivery  - Patient had a 2nd degree laceration, perineal healing reviewed. Patient expressed understanding - Patient has urinary incontinence? No  - Patient is safe to resume physical and sexual activity  6.  Health Maintenance - Unclear as to pap history, will call pt (noted after pt left)  Armandina Stammer, RN Center for [employer], [facility]  [place], [place]

## 2020-10-21 DIAGNOSIS — Z30013 Encounter for initial prescription of injectable contraceptive: Secondary | ICD-10-CM

## 2020-10-21 MED ORDER — MEDROXYPROGESTERONE ACETATE 150 MG/ML IM SUSP
150.0000 mg | Freq: Once | INTRAMUSCULAR | Status: AC
  Administered 2020-10-21: 150 mg via INTRAMUSCULAR

## 2020-10-21 NOTE — Addendum Note (Signed)
Addended by: Anell Barr on: 10/21/2020 10:26 AM   Modules accepted: Orders

## 2021-01-05 ENCOUNTER — Ambulatory Visit: Payer: No Typology Code available for payment source | Admitting: Advanced Practice Midwife

## 2021-01-09 ENCOUNTER — Other Ambulatory Visit: Payer: Self-pay

## 2021-01-12 ENCOUNTER — Other Ambulatory Visit: Payer: Self-pay

## 2021-01-12 ENCOUNTER — Ambulatory Visit (INDEPENDENT_AMBULATORY_CARE_PROVIDER_SITE_OTHER): Payer: Medicaid Other

## 2021-01-12 VITALS — BP 132/87 | HR 88 | Wt 151.0 lb

## 2021-01-12 DIAGNOSIS — Z3042 Encounter for surveillance of injectable contraceptive: Secondary | ICD-10-CM

## 2021-01-12 MED ORDER — MEDROXYPROGESTERONE ACETATE 150 MG/ML IM SUSP: 150.0000 mg | INTRAMUSCULAR | 3 refills | Status: DC

## 2021-01-12 MED ORDER — MEDROXYPROGESTERONE ACETATE 150 MG/ML IM SUSP
150.0000 mg | Freq: Once | INTRAMUSCULAR | Status: AC
  Administered 2021-01-12: 14:00:00 150 mg via INTRAMUSCULAR

## 2021-01-12 NOTE — Progress Notes (Addendum)
Jessica Callahan here for Depo-Provera  Injection.  Injection administered without complication. Patient will return in 3 months for next injection.  [name] l Drayson Dorko, CMA 01/12/2021  2:03 PM    Patient was assessed and managed by nursing staff during this encounter. I have reviewed the chart and agree with the documentation and plan. I have also made any necessary editorial changes.  Wynelle Bourgeois, CNM 01/12/2021 8:53 PM

## 2021-02-18 ENCOUNTER — Ambulatory Visit: Payer: Medicaid Other | Admitting: Family Medicine

## 2021-03-05 ENCOUNTER — Other Ambulatory Visit (HOSPITAL_COMMUNITY)
Admission: RE | Admit: 2021-03-05 | Discharge: 2021-03-05 | Disposition: A | Discharge status: Home / Self Care | Payer: No Typology Code available for payment source | Source: Ambulatory Visit | Attending: Family Medicine | Admitting: Family Medicine

## 2021-03-05 ENCOUNTER — Encounter: Payer: Self-pay | Admitting: Family Medicine

## 2021-03-05 ENCOUNTER — Ambulatory Visit (INDEPENDENT_AMBULATORY_CARE_PROVIDER_SITE_OTHER): Payer: Medicaid Other | Admitting: Family Medicine

## 2021-03-05 ENCOUNTER — Other Ambulatory Visit: Payer: Self-pay

## 2021-03-05 VITALS — BP 133/84 | HR 85 | Ht 63.0 in | Wt 154.0 lb

## 2021-03-05 DIAGNOSIS — Z01419 Encounter for gynecological examination (general) (routine) without abnormal findings: Secondary | ICD-10-CM

## 2021-03-05 NOTE — Progress Notes (Signed)
GYNECOLOGY ANNUAL PREVENTATIVE CARE ENCOUNTER NOTE  Subjective:   Jessica Callahan is a 25 y.o. G23P1001 female here for a routine annual gynecologic exam.  Current complaints: none.   Denies abnormal vaginal bleeding, discharge, pelvic pain, problems with intercourse or other gynecologic concerns.    Gynecologic History No LMP recorded. Patient has had an injection. Patient is sexually active  Contraception: Depo-Provera injections Last Pap: uncertain Last mammogram: n/a.  Colorectal Cancer Screening: n/a.  Obstetric History OB History  Gravida Para Term Preterm AB Living  1 1 1     1   SAB IAB Ectopic Multiple Live Births        0 1    # Outcome Date GA Lbr Len/2nd Weight Sex Delivery Anes PTL Lv  1 Term 09/09/20 [redacted]w[redacted]d 08:29 / 00:48 6 lb 9.6 oz (2.994 kg) M Vag-Spont None  LIV    No past medical history on file.  Past Surgical History:  Procedure Laterality Date   right wrist surgery      Current Outpatient Medications on File Prior to Visit  Medication Sig Dispense Refill   medroxyPROGESTERone (DEPO-PROVERA) 150 MG/ML injection Inject 1 mL (150 mg total) into the muscle every 3 (three) months. 1 mL 3   Prenatal Vit-Fe Fumarate-FA (PRENATAL MULTIVITAMIN) TABS tablet Take 1 tablet by mouth daily at 12 noon.     acetaminophen (TYLENOL) 500 MG tablet Take 1,000 mg by mouth every 6 (six) hours as needed for mild pain, headache or moderate pain. (Patient not taking: Reported on 10/20/2020)     amLODipine (NORVASC) 5 MG tablet Take 1 tablet (5 mg total) by mouth daily. (Patient not taking: Reported on 10/20/2020) 30 tablet 0   amLODipine (NORVASC) 5 MG tablet TAKE 1 TABLET (5 MG TOTAL) BY MOUTH DAILY. (Patient not taking: Reported on 10/20/2020) 30 tablet 0   coconut oil OIL Apply 1 application topically as needed. (Patient not taking: No sig reported)  0   Ferrous Fumarate (HEMOCYTE - 106 MG FE) 324 (106 Fe) MG TABS tablet Take 1 tablet (106 mg of iron total) by mouth every other day.  (Patient not taking: Reported on 10/20/2020) 30 tablet 0   ferrous sulfate 325 (65 FE) MG tablet TAKE 1 TABLET BY MOUTH EVERY OTHER DAY. (Patient not taking: Reported on 10/20/2020) 30 tablet 0   medroxyPROGESTERone (DEPO-PROVERA) 150 MG/ML injection Inject 1 mL (150 mg total) into the muscle every 3 (three) months. (Patient not taking: No sig reported) 1 mL 0   No current facility-administered medications on file prior to visit.    No Known Allergies  Social History   Socioeconomic History   Marital status: Single    Spouse name: Not on file   Number of children: Not on file   Years of education: Not on file   Highest education level: Not on file  Occupational History   Not on file  Tobacco Use   Smoking status: Never   Smokeless tobacco: Never  Substance and Sexual Activity   Alcohol use: Never   Drug use: Never   Sexual activity: Yes    Birth control/protection: None  Other Topics Concern   Not on file  Social History Narrative   Not on file   Social Determinants of Health   Financial Resource Strain: Not on file  Food Insecurity: Not on file  Transportation Needs: Not on file  Physical Activity: Not on file  Stress: Not on file  Social Connections: Not on file  Intimate Partner  Violence: Not on file    No family history on file.  The following portions of the patient's history were reviewed and updated as appropriate: allergies, current medications, past family history, past medical history, past social history, past surgical history and problem list.  Review of Systems Pertinent items are noted in HPI.   Objective:  BP 133/84   Pulse 85   Ht 5\' 3"  (1.6 m)   Wt 154 lb (69.9 kg)   BMI 27.28 kg/m  Wt Readings from Last 3 Encounters:  03/05/21 154 lb (69.9 kg)  01/12/21 151 lb (68.5 kg)  10/20/20 151 lb (68.5 kg)     Chaperone present during exam  CONSTITUTIONAL: Well-developed, well-nourished female in no acute distress.  HENT:  Normocephalic,  atraumatic, External right and left ear normal. Oropharynx is clear and moist EYES: Conjunctivae and EOM are normal. Pupils are equal, round, and reactive to light. No scleral icterus.  NECK: Normal range of motion, supple, no masses.  Normal thyroid.   CARDIOVASCULAR: Normal heart rate noted, regular rhythm RESPIRATORY: Clear to auscultation bilaterally. Effort and breath sounds normal, no problems with respiration noted. BREASTS: Symmetric in size. No masses, skin changes, nipple drainage, or lymphadenopathy. ABDOMEN: Soft, normal bowel sounds, no distention noted.  No tenderness, rebound or guarding.  PELVIC: Normal appearing external genitalia; normal appearing vaginal mucosa and cervix.  No abnormal discharge noted. MUSCULOSKELETAL: Normal range of motion. No tenderness.  No cyanosis, clubbing, or edema.  2+ distal pulses. SKIN: Skin is warm and dry. No rash noted. Not diaphoretic. No erythema. No pallor. NEUROLOGIC: Alert and oriented to person, place, and time. Normal reflexes, muscle tone coordination. No cranial nerve deficit noted. PSYCHIATRIC: Normal mood and affect. Normal behavior. Normal judgment and thought content.  Assessment:  Annual gynecologic examination with pap smear   Plan:  1. Well Woman Exam Will follow up results of pap smear and manage accordingly. STD testing discussed. Patient requested GC/CT testing   Routine preventative health maintenance measures emphasized. Please refer to After Visit Summary for other counseling recommendations.    12/20/20, DO [facility]

## 2021-03-09 LAB — CYTOLOGY - PAP
Chlamydia: NEGATIVE
Comment: NEGATIVE
Comment: NORMAL
Diagnosis: NEGATIVE
Neisseria Gonorrhea: NEGATIVE

## 2021-03-29 ENCOUNTER — Ambulatory Visit: Payer: Medicaid Other

## 2021-03-30 ENCOUNTER — Ambulatory Visit: Payer: Medicaid Other

## 2021-03-30 ENCOUNTER — Ambulatory Visit (INDEPENDENT_AMBULATORY_CARE_PROVIDER_SITE_OTHER): Payer: Medicaid Other

## 2021-03-30 ENCOUNTER — Other Ambulatory Visit: Payer: Self-pay

## 2021-03-30 VITALS — BP 132/80 | HR 99

## 2021-03-30 DIAGNOSIS — Z3042 Encounter for surveillance of injectable contraceptive: Secondary | ICD-10-CM | POA: Diagnosis not present

## 2021-03-30 MED ORDER — MEDROXYPROGESTERONE ACETATE 150 MG/ML IM SUSP
150.0000 mg | Freq: Once | INTRAMUSCULAR | Status: AC
  Administered 2021-03-30: 150 mg via INTRAMUSCULAR

## 2021-03-30 NOTE — Progress Notes (Signed)
Patient tolerated injection well. Patient request injection in the left arm. Patient to return in three months for next injection. Armandina Stammer RN

## 2021-03-31 NOTE — Progress Notes (Signed)
Chart reviewed - agree with CMA/RN documentation.  ° °

## 2021-04-21 ENCOUNTER — Ambulatory Visit: Payer: Medicaid Other

## 2021-06-21 ENCOUNTER — Other Ambulatory Visit: Payer: Self-pay

## 2021-06-21 ENCOUNTER — Ambulatory Visit (INDEPENDENT_AMBULATORY_CARE_PROVIDER_SITE_OTHER): Payer: No Typology Code available for payment source

## 2021-06-21 VITALS — BP 129/86 | HR 92 | Wt 159.0 lb

## 2021-06-21 DIAGNOSIS — Z3042 Encounter for surveillance of injectable contraceptive: Secondary | ICD-10-CM

## 2021-06-21 MED ORDER — MEDROXYPROGESTERONE ACETATE 150 MG/ML IM SUSP
150.0000 mg | Freq: Once | INTRAMUSCULAR | Status: AC
  Administered 2021-06-21: 150 mg via INTRAMUSCULAR

## 2021-06-21 NOTE — Progress Notes (Addendum)
Patient presents for Depo Provera. Patient states her baby is doing well and crawling and meeting milestones. Armandina Stammer RN  Attestation of Attending Supervision of RN: Evaluation and management procedures were performed by the nurse under my supervision and collaboration.  I have reviewed the nursing note and chart, and I agree with the management and plan.  Carolyn L. Harraway-Smith, M.D., [facility]n [place]

## 2021-09-06 ENCOUNTER — Ambulatory Visit: Payer: Medicaid Other

## 2021-09-14 ENCOUNTER — Other Ambulatory Visit: Payer: Self-pay

## 2021-09-14 ENCOUNTER — Ambulatory Visit (INDEPENDENT_AMBULATORY_CARE_PROVIDER_SITE_OTHER): Payer: Medicaid Other

## 2021-09-14 VITALS — BP 118/82 | HR 112 | Wt 174.0 lb

## 2021-09-14 DIAGNOSIS — Z3042 Encounter for surveillance of injectable contraceptive: Secondary | ICD-10-CM | POA: Diagnosis not present

## 2021-09-14 MED ORDER — MEDROXYPROGESTERONE ACETATE 150 MG/ML IM SUSP
150.0000 mg | Freq: Once | INTRAMUSCULAR | Status: AC
  Administered 2021-09-14: 150 mg via INTRAMUSCULAR

## 2021-09-14 NOTE — Progress Notes (Signed)
Jessica Callahan here for Depo-Provera  Injection.  Injection administered without complication. Patient will return in 3 months for next injection. ? ?[name] l [name], CMA ?09/14/2021  10:25 AM ? ? ?  ?

## 2021-12-07 ENCOUNTER — Ambulatory Visit: Payer: No Typology Code available for payment source

## 2021-12-07 ENCOUNTER — Other Ambulatory Visit: Payer: Self-pay

## 2021-12-07 DIAGNOSIS — Z3042 Encounter for surveillance of injectable contraceptive: Secondary | ICD-10-CM

## 2021-12-07 MED ORDER — MEDROXYPROGESTERONE ACETATE 150 MG/ML IM SUSP: 150.0000 mg | INTRAMUSCULAR | 3 refills | Status: AC

## 2021-12-09 ENCOUNTER — Ambulatory Visit (INDEPENDENT_AMBULATORY_CARE_PROVIDER_SITE_OTHER): Payer: No Typology Code available for payment source

## 2021-12-09 DIAGNOSIS — Z3042 Encounter for surveillance of injectable contraceptive: Secondary | ICD-10-CM | POA: Diagnosis not present

## 2021-12-09 MED ORDER — MEDROXYPROGESTERONE ACETATE 150 MG/ML IM SUSP
150.0000 mg | Freq: Once | INTRAMUSCULAR | Status: AC
  Administered 2021-12-09: 150 mg via INTRAMUSCULAR

## 2021-12-09 NOTE — Progress Notes (Cosign Needed)
Date last pap: 03-05-2021. Last Depo-Provera: 09-14-2021. Side Effects if any: None. Serum HCG indicated? N/A. Depo-Provera 150 mg IM given by: Burnard Leigh RN . Next appointment due 03/07/2022.  Armandina Stammer RN

## 2022-02-01 NOTE — Progress Notes (Signed)
Patient was assessed and managed by nursing staff during this encounter. I have reviewed the chart and agree with the documentation and plan. I have also made any necessary editorial changes.  Ginnifer Creelman, MD 02/01/2022 3:12 PM   

## 2022-03-01 ENCOUNTER — Ambulatory Visit (INDEPENDENT_AMBULATORY_CARE_PROVIDER_SITE_OTHER): Payer: No Typology Code available for payment source

## 2022-03-01 VITALS — BP 119/70 | HR 75 | Wt 188.0 lb

## 2022-03-01 DIAGNOSIS — Z3042 Encounter for surveillance of injectable contraceptive: Secondary | ICD-10-CM

## 2022-03-01 MED ORDER — MEDROXYPROGESTERONE ACETATE 150 MG/ML IM SUSP
150.0000 mg | Freq: Once | INTRAMUSCULAR | Status: AC
  Administered 2022-03-01: 150 mg via INTRAMUSCULAR

## 2022-03-01 NOTE — Progress Notes (Signed)
Jessica Callahan here for Depo-Provera  Injection.  Injection administered without complication. Patient will return in 3 months for next injection.  [name] l [name], CMA 03/01/2022  1:29 PM

## 2022-03-07 ENCOUNTER — Ambulatory Visit: Payer: No Typology Code available for payment source

## 2022-03-09 ENCOUNTER — Ambulatory Visit: Payer: No Typology Code available for payment source | Admitting: Family Medicine

## 2022-05-17 ENCOUNTER — Ambulatory Visit: Payer: No Typology Code available for payment source

## 2022-05-26 ENCOUNTER — Encounter: Payer: Self-pay | Admitting: Family Medicine

## 2022-05-26 ENCOUNTER — Ambulatory Visit (INDEPENDENT_AMBULATORY_CARE_PROVIDER_SITE_OTHER): Payer: No Typology Code available for payment source | Admitting: Family Medicine

## 2022-05-26 VITALS — BP 125/82 | HR 101 | Ht 63.0 in | Wt 197.0 lb

## 2022-05-26 DIAGNOSIS — Z3042 Encounter for surveillance of injectable contraceptive: Secondary | ICD-10-CM

## 2022-05-26 DIAGNOSIS — Z01419 Encounter for gynecological examination (general) (routine) without abnormal findings: Secondary | ICD-10-CM | POA: Diagnosis not present

## 2022-05-26 MED ORDER — MEDROXYPROGESTERONE ACETATE 150 MG/ML IM SUSP
150.0000 mg | Freq: Once | INTRAMUSCULAR | Status: AC
Start: 2022-05-26 — End: 2022-05-26
  Administered 2022-05-26: 150 mg via INTRAMUSCULAR

## 2022-05-26 NOTE — Progress Notes (Signed)
   ANNUAL EXAM Patient name: Jessica Callahan MRN [id number]  Date of birth: Nov 29, 1995 Chief Complaint:   Annual Exam  History of Present Illness:   Jessica Callahan is a 26 y.o.  G39P1001  female  being seen today for a routine annual exam.  Current complaints: some cramping and spotting this week before next dose of depo. Thinking about changing birth control Is getting married next year. Maybe pregnancy the year after  No LMP recorded. Patient has had an injection.    Last pap 2022. Results were: NILM w/ HRHPV negative. H/O abnormal pap: no Last mammogram: n/a      No data to display               No data to display           Review of Systems:   Pertinent items are noted in HPI Denies any headaches, blurred vision, fatigue, shortness of breath, chest pain, abdominal pain, abnormal vaginal discharge/itching/odor/irritation, problems with periods, bowel movements, urination, or intercourse unless otherwise stated above. Pertinent History Reviewed:  Reviewed past medical,surgical, social and family history.  Reviewed problem list, medications and allergies. Physical Assessment:   Vitals:   05/26/22 1313  BP: 125/82  Pulse: (!) 101  Weight: 197 lb (89.4 kg)  Height: 5\' 3"  (1.6 m)  Body mass index is 34.9 kg/m.        Physical Examination:   General appearance - well appearing, and in no distress  Mental status - alert, oriented to person, place, and time  Psych:  She has a normal mood and affect  Skin - warm and dry, normal color, no suspicious lesions noted  Chest - effort normal, all lung fields clear to auscultation bilaterally  Heart - normal rate and regular rhythm  Neck:  midline trachea, no thyromegaly or nodules  Breasts - declined  Abdomen - soft, nontender, nondistended, no masses or organomegaly  Pelvic - VULVA: normal appearing vulva with no masses, tenderness or lesions  VAGINA: normal appearing vagina with normal color and discharge, no lesions  CERVIX:  normal appearing cervix without discharge or lesions, no CMT  Thin prep pap is not done   UTERUS: uterus is felt to be normal size, shape, consistency and nontender   ADNEXA: No adnexal masses or tenderness noted.  Extremities:  No swelling or varicosities noted  Chaperone present for exam  Assessment & Plan:  1. Well woman exam with routine gynecological exam - medroxyPROGESTERone (DEPO-PROVERA) injection 150 mg  2. Encounter for surveillance of injectable contraceptive - medroxyPROGESTERone (DEPO-PROVERA) injection 150 mg   Labs/procedures today: none  No orders of the defined types were placed in this encounter.   Meds:  Meds ordered this encounter  Medications   medroxyPROGESTERone (DEPO-PROVERA) injection 150 mg    Follow-up: No follow-ups on file.  , DO 05/26/2022 1:43 PM

## 2022-08-17 ENCOUNTER — Ambulatory Visit (INDEPENDENT_AMBULATORY_CARE_PROVIDER_SITE_OTHER): Payer: No Typology Code available for payment source

## 2022-08-17 VITALS — BP 131/73 | HR 92 | Wt 201.0 lb

## 2022-08-17 DIAGNOSIS — Z3042 Encounter for surveillance of injectable contraceptive: Secondary | ICD-10-CM

## 2022-08-17 MED ORDER — MEDROXYPROGESTERONE ACETATE 150 MG/ML IM SUSP
150.0000 mg | Freq: Once | INTRAMUSCULAR | Status: AC
Start: 2022-08-17 — End: 2022-08-17
  Administered 2022-08-17: 150 mg via INTRAMUSCULAR

## 2022-08-17 NOTE — Progress Notes (Addendum)
Orvan Seen here for Depo-Provera  Injection.  Injection administered without complication. Patient will return in 3 months for next injection.  Viviano Bir l Jovi Zavadil, CMA 08/17/2022  1:40 PM  Attestation of Attending Supervision of CMA/RN: Evaluation and management procedures were performed by the nurse under my supervision and collaboration.  I have reviewed the nursing note and chart, and I agree with the management and plan.  Carolyn L. Harraway-Smith, M.D., [facility]lynn June

## 2022-11-08 ENCOUNTER — Ambulatory Visit: Payer: No Typology Code available for payment source

## 2022-11-08 VITALS — BP 133/75 | HR 77 | Wt 201.0 lb

## 2022-11-08 DIAGNOSIS — Z3042 Encounter for surveillance of injectable contraceptive: Secondary | ICD-10-CM

## 2022-11-08 MED ORDER — MEDROXYPROGESTERONE ACETATE 150 MG/ML IM SUSY
150.0000 mg | PREFILLED_SYRINGE | Freq: Once | INTRAMUSCULAR | Status: AC
  Administered 2022-11-08: 150 mg via INTRAMUSCULAR

## 2022-11-08 NOTE — Progress Notes (Signed)
Jessica Callahan here for Depo-Provera  Injection.  Injection administered without complication. Patient will return in 3 months for next injection.  Enrica Corliss l Rosealynn Mateus, CMA 11/08/2022  1:23 PM

## 2023-02-07 ENCOUNTER — Ambulatory Visit: Payer: No Typology Code available for payment source
